# Patient Record
Sex: Male | Born: 1960 | Race: White | Hispanic: No | Marital: Married | Smoking: Current every day smoker
Health system: Southern US, Community
[De-identification: ages and names within clinical notes are randomized; demographics above are authoritative.]

## PROBLEM LIST (undated history)

## (undated) DIAGNOSIS — I2510 Atherosclerotic heart disease of native coronary artery without angina pectoris: Secondary | ICD-10-CM

## (undated) DIAGNOSIS — I219 Acute myocardial infarction, unspecified: Secondary | ICD-10-CM

## (undated) DIAGNOSIS — J189 Pneumonia, unspecified organism: Secondary | ICD-10-CM

## (undated) DIAGNOSIS — E785 Hyperlipidemia, unspecified: Secondary | ICD-10-CM

## (undated) DIAGNOSIS — M1990 Unspecified osteoarthritis, unspecified site: Secondary | ICD-10-CM

## (undated) DIAGNOSIS — I10 Essential (primary) hypertension: Secondary | ICD-10-CM

## (undated) DIAGNOSIS — N200 Calculus of kidney: Secondary | ICD-10-CM

## (undated) DIAGNOSIS — E119 Type 2 diabetes mellitus without complications: Secondary | ICD-10-CM

## (undated) DIAGNOSIS — Z6832 Body mass index (BMI) 32.0-32.9, adult: Secondary | ICD-10-CM

## (undated) HISTORY — DX: Atherosclerotic heart disease of native coronary artery without angina pectoris: I25.10

## (undated) HISTORY — PX: CARDIAC CATHETERIZATION: SHX172

## (undated) HISTORY — DX: Hyperlipidemia, unspecified: E78.5

## (undated) HISTORY — DX: Body mass index (BMI) 32.0-32.9, adult: Z68.32

## (undated) HISTORY — PX: TRACHEOSTOMY: SUR1362

## (undated) HISTORY — PX: NO PAST SURGERIES: SHX2092

---

## 2004-04-01 DIAGNOSIS — N200 Calculus of kidney: Secondary | ICD-10-CM

## 2004-04-01 HISTORY — DX: Calculus of kidney: N20.0

## 2005-09-26 ENCOUNTER — Inpatient Hospital Stay (HOSPITAL_COMMUNITY): Admission: EM | Admit: 2005-09-26 | Discharge: 2005-09-28 | Payer: Self-pay | Admitting: Emergency Medicine

## 2005-09-27 ENCOUNTER — Encounter (INDEPENDENT_AMBULATORY_CARE_PROVIDER_SITE_OTHER): Payer: Self-pay | Admitting: Specialist

## 2007-09-02 ENCOUNTER — Emergency Department (HOSPITAL_COMMUNITY): Admission: EM | Admit: 2007-09-02 | Discharge: 2007-09-02 | Payer: Self-pay | Admitting: Emergency Medicine

## 2008-06-27 ENCOUNTER — Emergency Department (HOSPITAL_COMMUNITY): Admission: EM | Admit: 2008-06-27 | Discharge: 2008-06-27 | Payer: Self-pay | Admitting: Emergency Medicine

## 2009-01-26 ENCOUNTER — Emergency Department (HOSPITAL_COMMUNITY): Admission: EM | Admit: 2009-01-26 | Discharge: 2009-01-27 | Payer: Self-pay | Admitting: Emergency Medicine

## 2010-07-05 LAB — LIPASE, BLOOD: Lipase: 31 U/L (ref 11–59)

## 2010-07-05 LAB — COMPREHENSIVE METABOLIC PANEL
Albumin: 4.1 g/dL (ref 3.5–5.2)
CO2: 30 mEq/L (ref 19–32)
GFR calc non Af Amer: >60 mL/min (ref >60)
Sodium: 140 mEq/L (ref 135–145)
Total Bilirubin: 0.5 mg/dL (ref 0.3–1.2)
Total Protein: 7.1 g/dL (ref 6.0–8.3)

## 2010-07-05 LAB — URINALYSIS, ROUTINE W REFLEX MICROSCOPIC
Bilirubin Urine: NEGATIVE
Glucose, UA: NEGATIVE mg/dL
Leukocytes, UA: NEGATIVE
pH: 6 (ref 5.0–8.0)

## 2010-07-05 LAB — URINE CULTURE: Colony Count: NO GROWTH

## 2010-07-05 LAB — URINE MICROSCOPIC-ADD ON

## 2010-07-05 LAB — DIFFERENTIAL
Lymphocytes Relative: 28 % (ref 12–46)
Monocytes Relative: 7 % (ref 3–12)
Neutro Abs: 6.9 10*3/uL (ref 1.7–7.7)
Neutrophils Relative %: 63 % (ref 43–77)

## 2010-07-05 LAB — CBC
HCT: 43 % (ref 39.0–52.0)
Hemoglobin: 14.8 g/dL (ref 13.0–17.0)
MCV: 88.8 fL (ref 78.0–100.0)
Platelets: 207 10*3/uL (ref 150–400)
WBC: 11 10*3/uL — ABNORMAL HIGH (ref 4.0–10.5)

## 2010-07-12 LAB — DIFFERENTIAL
Basophils Relative: 0 % (ref 0–1)
Lymphocytes Relative: 29 % (ref 12–46)
Lymphs Abs: 2.6 10*3/uL (ref 0.7–4.0)
Monocytes Absolute: 0.5 10*3/uL (ref 0.1–1.0)
Neutro Abs: 5.8 10*3/uL (ref 1.7–7.7)

## 2010-07-12 LAB — URINALYSIS, ROUTINE W REFLEX MICROSCOPIC
Glucose, UA: NEGATIVE mg/dL
Hgb urine dipstick: NEGATIVE
Nitrite: NEGATIVE
Protein, ur: NEGATIVE mg/dL
Urobilinogen, UA: 1 mg/dL (ref 0.0–1.0)

## 2010-07-12 LAB — CBC
HCT: 41.7 % (ref 39.0–52.0)
Hemoglobin: 14.6 g/dL (ref 13.0–17.0)
MCHC: 35 g/dL (ref 30.0–36.0)
MCV: 88 fL (ref 78.0–100.0)
Platelets: 205 10*3/uL (ref 150–400)
RBC: 4.73 MIL/uL (ref 4.22–5.81)
RDW: 12.1 % (ref 11.5–15.5)
WBC: 9.1 10*3/uL (ref 4.0–10.5)

## 2010-07-12 LAB — COMPREHENSIVE METABOLIC PANEL
AST: 23 U/L (ref 0–37)
CO2: 29 mEq/L (ref 19–32)
Calcium: 8.9 mg/dL (ref 8.4–10.5)
Chloride: 99 mEq/L (ref 96–112)
Sodium: 137 mEq/L (ref 135–145)

## 2010-07-12 LAB — URINE CULTURE: Colony Count: NO GROWTH

## 2010-08-17 NOTE — Consult Note (Signed)
NAME:  Eddie Lewis, Eddie Lewis               ACCOUNT NO.:  [id number]   MEDICAL RECORD NO.:  [id number]          PATIENT TYPE:  INP   LOCATION:  A337                          FACILITY:  [facility]   PHYSICIAN:  Lonia Blood, M.D.      DATE OF BIRTH:  [birth year]   DATE OF CONSULTATION:  09/26/2005  DATE OF DISCHARGE:                                   CONSULTATION   PRIMARY CARE PHYSICIAN:  Patient is unassigned to [place].   REQUESTING PHYSICIAN:  Dennie Maizes, M.D.   REASON FOR CONSULTATION:  High blood sugar and preop management.   HISTORY OF PRESENT ILLNESS:  Patient is a 50 year old Caucasian man who was  admitted by Dr. Rito Ehrlich today secondary to right distal ureteral calculus  with obstruction associated with right hydronephrosis, right renal colic,  horseshoe kidneys, and elevated glucose.  He is scheduled to have a  procedure tomorrow, including cystoscopy, ureteroscopy, right retrograde  pyelogram with the possibility of stone extraction.  Patient was noted to  have high blood sugar.  With a family history of diabetes, we are being  consulted for further management.   Patient denied any polydipsia or polyphagia.  Denied any polyuria, although  he drinks a lot of Gatorade.   He has a family history of diabetes, which was in a large percentage of his  family members, but he has never been diagnosed with diabetes.   PAST MEDICAL HISTORY:  Mainly ureteral stones, tobacco dependence.   His current medications include Keflex, Flomax, and tablets that he was just  started on.   He is allergic to CODEINE.   SOCIAL HISTORY:  Patient smokes about 1-1/2 packs per day.  He is a social  drinker.  No IV drug use.   FAMILY HISTORY:  Significant for diabetes.  Some family members have  hypertension.   REVIEW OF SYSTEMS:  Mainly pain, inability to urinate, and general  discomfort from his kidney stone.   PHYSICAL EXAMINATION:  VITAL SIGNS:  Temperature 98.9, blood pressure  114/63, pulse 88,  respiratory rate 24.  His sats are 96% on room air.  GENERAL:  He is awake, alert and oriented in no acute distress.  HEENT:  PERRL.  EOMI.  NECK:  Supple.  No JVD.  No lymphadenopathy.  RESPIRATORY:  He has good air entry bilaterally.  No wheezes or rales.  CARDIOVASCULAR:  He has S1 and S2.  No murmurs.  ABDOMEN:  Obese.  Soft with some suprapubic distention, which is mildly  tender.  Positive bowel sounds.  EXTREMITIES:  No clubbing, cyanosis or edema.   His labs showed a white count of 15.7, hemoglobin 14.1, and platelet count  240 with a left shift ANC of 13.6.  Sodium is 136, potassium 3.9, chloride  107, CO2 22, glucose 141, BUN 15, creatinine 1.3, total bilirubin 0.7.  The  rest of the LFTs are normal.  His lipase is 38.  Urinalysis is negative for  glucose.  Only positive for blood.  Urine microscopy showed RBCs 11-20,  indicating some hematuria.   ASSESSMENT:  This is a 50 year old gentleman admitted  for urology procedure  and now having high blood sugar.  My impression is therefore the patient may  be a prediabetic but not quite a diabetic.  The lack of glucosuria indicates  his high blood sugar may be secondary to D5 water with intake of some  glucose, either during his stay in the ED or prior to coming to the ED;  however, with the family history of diabetes, the patient could be at risk  for having diabetes.   RECOMMENDATIONS:  Will check serial CBGs while in the hospital.  Check  hemoglobin A1C.  Depending on the number, if it is more than 7, then patient  is likely to be a diabetic and will need to start getting on some formal  treatment.  We should give sliding-scale insulin orders if the blood sugars  are more than 150.  We should give patient some nicotine patch also to help  him with his tobacco dependence.  I will follow with you and treat  accordingly, depending on the hemoglobin A1C and his serial capillary blood  glucose.  Thank you for the consult.       Lonia Blood, M.D.  Electronically Signed     LG/MEDQ  D:  09/26/2005  T:  09/26/2005  Job:  [id number]

## 2010-08-17 NOTE — H&P (Signed)
NAME:  Eddie Lewis, PFIFFNER NO.:  [id number]   MEDICAL RECORD NO.:  [id number]          PATIENT TYPE:  INP   LOCATION:  A337                          FACILITY:  [facility]   PHYSICIAN:  Dennie Maizes, M.D.   DATE OF BIRTH:  12/16/1960   DATE OF ADMISSION:  09/26/2005  DATE OF DISCHARGE:  LH                                HISTORY & PHYSICAL   CHIEF COMPLAINT:  Severe suprapubic pain radiating to the right testis.  Right ureteral calculus with obstruction.   HISTORY OF PRESENT ILLNESS:  This 50 year old male experienced severe  suprapubic pain radiating to the right testis about four days while he was  in [place], [place].  He went to the [facility] system and  evaluated there.  CT scan of the abdomen and pelvis revealed a 3 mm sized  right ureteral calculus with obstruction.  The patient had good pain relief,  and he was sent home.  He had recurrent severe pain, and he came to the  emergency room at [facility].  He is not having any fever, chills,  voiding difficulty, or gross hematuria.  There is no past history of  urolithiasis.  The CT scan of the abdomen and pelvis without contrast was  repeated.  This revealed horseshoe kidneys.  There was hydronephrosis of the  right renal moiety .  Peripelvic edema with some extravasation of urine on  the right side.  There was a 3 mm sized stone in the right distal ureter  with obstruction.  There was no stone on the left side.  The patient's pain  was not adequately controlled in the emergency room.  He has been admitted  to the hospital for pain control and further treatment.   PAST MEDICAL HISTORY:  Negative for ureterolithiasis.  No medical illnesses.   MEDICATIONS:  Keflex, Flomax, and Tylox.  The Flomax was started at [facility] for  stone passage.   ALLERGIES:  CODEINE.   PHYSICAL EXAMINATION:  GENERAL:  The patient is comfortable after receiving  parenteral narcotics.  HEENT:  Normal.  NECK:  No masses.  LUNGS:  Clear to auscultation.  HEART:  Regular rate and rhythm.  No murmurs.  ABDOMEN:  Soft.  No palpable flank mass.  No CVA tenderness.  Moderate  suprapubic tenderness was noted.  GU:  Penis normal.  Both testes are normal.  There is thickening and  induration of the left epididymis.  RECTAL:  Not done.   Urinalysis:  Blood moderate.  Nitrate negative.  Leukocyte esterase  negative.  RBCs 11-20 per high-powered field.  Serum lipase normal, 38 U/L.  BUN 15, creatinine 1.3.  Electrolytes within normal range.  Glucose is  elevated at 141.  CBC:  WBC 15.7, hemoglobin 14.1, hematocrit 41.5.   IMPRESSION:  Right distal ureteral calculus with obstruction, right  hydronephrosis, right renal colic, horseshoe kidneys, elevated glucose.   PLAN:  1.  Admit the patient to the hospital.  2.  IV fluids.  3.  Parenteral narcotics.  4.  Hospitalist consult for elevated glucose.  5.  I discussed with the patient  regarding management options.  I discussed      with him about observation versus ureteroscopy and stone extraction.  As      the patient has had severe pain for several days, he would like to      proceed with stone removal.  He is scheduled to undergo cystoscopy,      right retrograde pyelogram, right ureteroscopy with stone extraction,      and right ureteral stent placement under anesthesia on September 27, 2005.  I      have explained to the patient regarding the procedure, operative      details, alternate treatments, and outcome with possible risks and      complications.  Stone migration with the inability to extract the stone      were explained to the patient.  Bleeding, infection, and ureteral      perforation were explained to the patient, and he agreed for the      procedure to be done.      Dennie Maizes, M.D.  Electronically Signed     SK/MEDQ  D:  09/26/2005  T:  09/26/2005  Job:  [id number]

## 2010-08-17 NOTE — Op Note (Signed)
NAME:  QUINTAN, SALDIVAR NO.:  [id number]   MEDICAL RECORD NO.:  [id number]          PATIENT TYPE:  INP   LOCATION:  A337                          FACILITY:  [facility]   PHYSICIAN:  Dennie Maizes, M.D.   DATE OF BIRTH:  Mar 25, 1961   DATE OF PROCEDURE:  09/27/2005  DATE OF DISCHARGE:                                 OPERATIVE REPORT   PREOPERATIVE DIAGNOSIS:  Right distal ureteral calculus with obstruction,  right renal colic, right hydronephrosis, horseshoe kidneys.   POSTOPERATIVE DIAGNOSIS:  Right distal ureteral calculus with obstruction,  right renal colic, right hydronephrosis, horseshoe kidneys.   OPERATIVE PROCEDURE:  Cystoscopy, right retrograde pyelogram, right  ureteroscopy, stone extraction, and right ureteral stent placement.   ANESTHESIA:  General.   SURGEON:  Dennie Maizes, M.D.   COMPLICATIONS:  None.   DRAINS:  38 French 6 cm size right ureteral stent with a string, 16-French  Foley catheter in the bladder.   INDICATIONS FOR THE PROCEDURE:  This 50 year old male is evaluated for  severe right flank pain.  X-rays revealed horseshoe kidneys with obstruction  of the right collecting system due to a 3 mm size distal ureteral calculus.  The patient was unable to pass the stone.  He was taken to the O.R. today  for cystoscopy, retrograde pyelogram, ureteroscopy, stone extraction, and  stent placement.   DESCRIPTION OF THE PROCEDURE:  General anesthesia was induced, and the  patient was placed on the O.R. table in the dorsal lithotomy position.  The  lower abdomen and genitalia were prepped and draped in a sterile fashion.  Examination revealed a mild glandular hypospadias.  The urethral meatus was  slightly on the ventral side of the glans penis.  A 22 French cystoscope was  then introduced into the bladder under direct vision.  The urethra was  normal.  The prostate was small.  There was no evidence of any obstruction.  The bladder mucosa, trigone,  and ureteral orifices were normal.  A 5 French  rigid catheter was then placed in the right ureteral orifice.  About 7 cc of  Renografin-60 was injected into the collecting system.  A retrograde  pyelogram was done by using C-arm fluoroscopy.  There was a 3 mm size  filling defect in the distal ureter at the level of the pelvic brim.  There  was proximal hydroureter and hydronephrosis.   A 5 French open-ended catheter was then placed in the right distal ureter.  A 0.138 inch Bentson guidewire was then inserted into the right collecting  system without any difficulty.   The distal ureter was dilated using a using an 18 French 6 cm balloon  dilating catheter.  The balloon dilating catheter was then removed, leaving  the guidewire in place.  Ureteroscopy was done with a rigid ureteroscope.  The stone was seen at the level of the pelvic brim.  The stone was extracted  with the nitinol 0 tip  4 wire basket without any difficulty.  A 6 French 24  cm size stent was then inserted in the right collecting system.  The  cystoscope  was then removed.  A 16 French Foley catheter was inserted into  the bladder.  The patient was transferred to the PACU in a satisfactory  condition.      Dennie Maizes, M.D.  Electronically Signed     SK/MEDQ  D:  09/27/2005  T:  09/27/2005  Job:  [id number]

## 2010-08-17 NOTE — Discharge Summary (Signed)
NAME:  Eddie Lewis, Eddie Lewis NO.:  [id number]   MEDICAL RECORD NO.:  [id number]          PATIENT TYPE:  INP   LOCATION:  A337                          FACILITY:  [facility]   PHYSICIAN:  Dennie Maizes, M.D.   DATE OF BIRTH:  1960-10-31   DATE OF ADMISSION:  09/26/2005  DATE OF DISCHARGE:  06/30/2007LH                                 DISCHARGE SUMMARY   CONSULTING PHYSICIAN:  Dr. Mikeal Hawthorne   FINAL DIAGNOSES:  1.  Right distal ureteral calculus with obstruction.  2.  Right renal colic.  3.  Right hydronephrosis.  4.  Horseshoe kidneys.  5.  Elevated glucose.   OPERATIVE PROCEDURE:  Cystoscopy, right retrograde pyelogram, right  ureteroscopy stone extraction, and right ureteral stent placement done on  September 27, 2005.   COMPLICATIONS:  None.   DISCHARGE SUMMARY:  This 50 year old male experienced severe suprapubic pain  radiating to the right testes on September 22, 2005 when he was in  [place], [place].  He went to the [facility] and evaluated there.  CT scan of the abdomen and pelvis revealed a 3  mm right ureteral calculus with obstruction.  The patient had good pain  relief and he was sent home.  Had recurrence of the pain and he came to the  Emergency Room at [facility].  He was not having any fever, chills,  voiding difficulty, or gross hematuria.  There was no past history of  urolithiasis.  The CT scan of abdomen and pelvis without contrast was  repeated.  This revealed horseshoe kidneys.  There was hydronephrosis of the  right renal moiety .  Peripelvic edema with some extravasation of urine was  noted on the right side.  There was a 3 mm size stone in the right distal  ureter with obstruction.  There was no stone on the left side.  The  patient's pain was not adequately controlled in the emergency room.  He was  admitted to the hospital for pain control and further treatment.   PAST MEDICAL HISTORY:  Negative for  urolithiasis.  No medical illnesses.   MEDICATIONS:  Keflex, Flomax, and Tylox.  The Flomax was started at [facility] for  stone passage.   ALLERGIES:  CODEINE.   PHYSICAL EXAMINATION:  GENERAL:  The patient was comfortable after receiving  parenteral narcotics.  HEENT:  Normal.  NECK:  No masses.  LUNGS:  Clear to auscultation.  HEART:  Regular rate and rhythm.  No murmurs.  ABDOMEN:  Soft.  No palpable flank mass or CVA tenderness.  Moderate  suprapubic tenderness was noted.  GENITOURINARY:  Penis and testes were normal.  There was thickening,  induration of the left epididymis.  RECTAL:  Not done.   ADMISSION LABORATORIES:  Blood moderate, nitrate negative, leukocyte  esterase negative, rbc 11-20 per high powered field.  Serum lipase normal.  BUN 15, creatinine 1.3.  Electrolytes within normal range.  Glucose was  elevated 141.  CBC:  WBC 15.7, hemoglobin 14.1, hematocrit 41.5.   HOSPITAL COURSE:  The patient was admitted to the hospital and treated  with  IV fluids and parenteral narcotics.  Dr. Mikeal Hawthorne, the hospitalist, was  consulted regarding elevated blood glucose levels.  Please refer to his  consultation report.  Hemoglobin A1c was checked.  His blood glucose levels  were closely monitored.  On September 27, 2005 patient was taken to the OR.  Under general anesthesia cystoscopy, right retrograde pyelogram, right  ureteroscopy stone extraction, and right ureteral stent placement were done.  Stone was sent for [employer].  The patient also had Foley catheter  drainage.  He was discharged and sent home on September 28, 2005.  At the time of  discharge he was doing well.  He remained afebrile and his glucose was 133.  Renal function was normal.  He was given Cipro 500 mg p.o. b.i.d. for seven  days and he was advised to continue the pain pills.  He will return to the  office on October 25, 2005 at which time the Foley catheter and ureteral stent  with the string will be removed.    CONDITION ON DISCHARGE:  Stable.  He will see his regular family physician  for follow-up of elevated blood glucose levels.      Dennie Maizes, M.D.  Electronically Signed     SK/MEDQ  D:  10/25/2005  T:  10/25/2005  Job:  [id number]

## 2010-09-18 IMAGING — CT CT ABDOMEN W/O CM
2 of 4 series · 16 of 46 positions shown, 18 images · non-contrast
Comparison: Calcified granulomas in the right lower lobes noted.

CLINICAL DATA: Right flank pain.  History urinary tract stones.

CT ABDOMEN AND PELVIS WITHOUT CONTRAST
TECHNIQUE: Multidetector CT imaging of the abdomen and pelvis was
performed following the standard protocol without intravenous
contrast.

[Series 2: standard/full over (age)lbs 5.0 · axial · 0.70mm/px · z∈[-466,-52]mm · 13 of 91 slices shown, 15 images]
[im 4/91  soft-tissue]
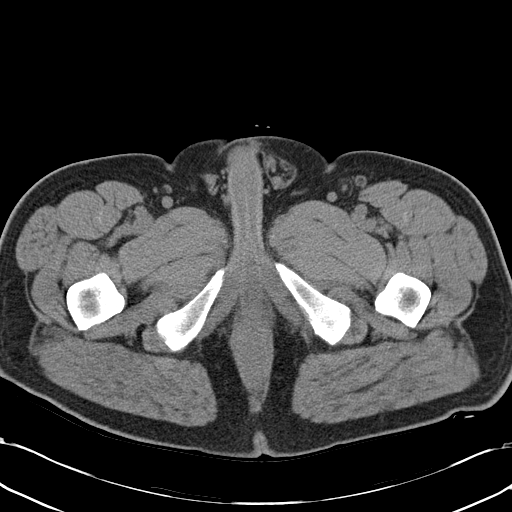
[im 4/91  bone]
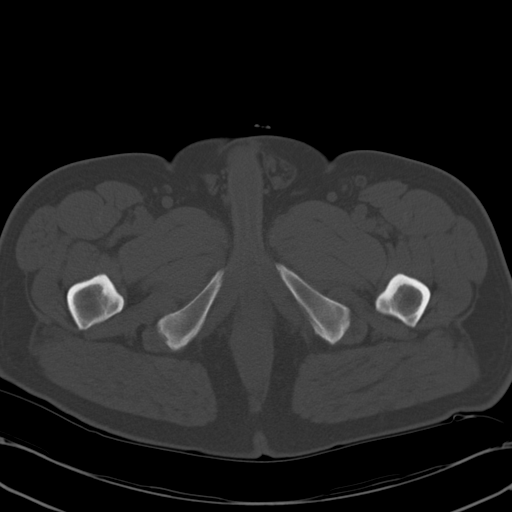
[im 11/91  soft-tissue]
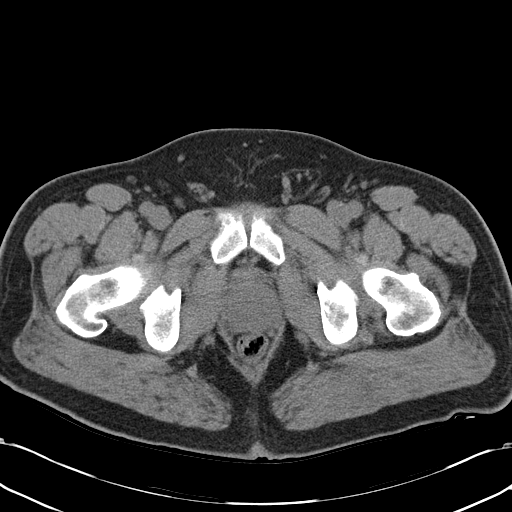
[im 19/91  soft-tissue]
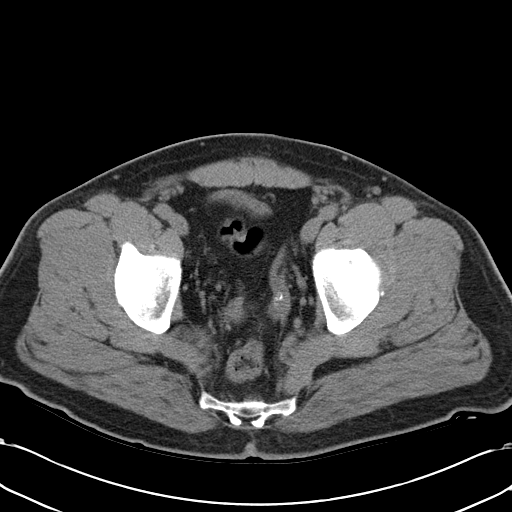
[im 26/91  soft-tissue]
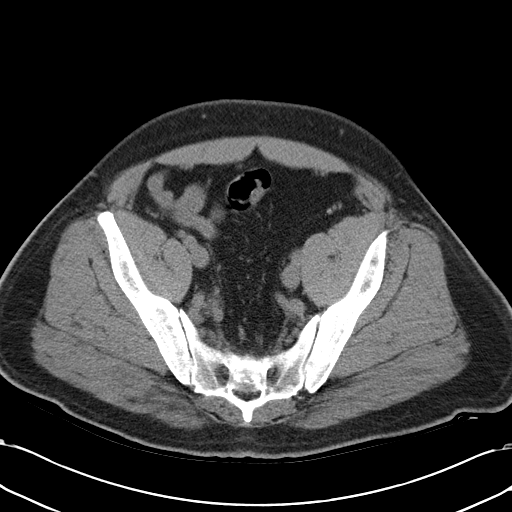
[im 33/91  soft-tissue]
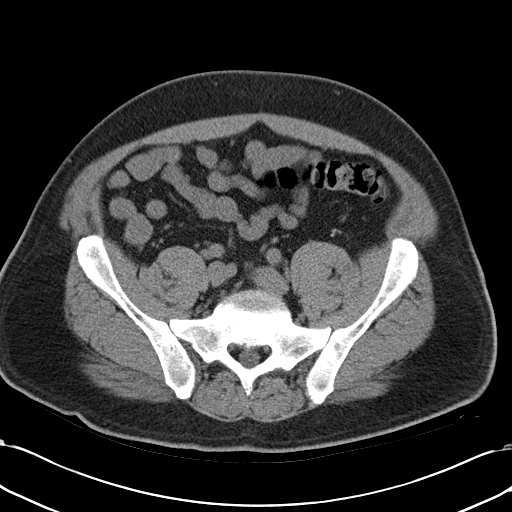
[im 40/91  soft-tissue]
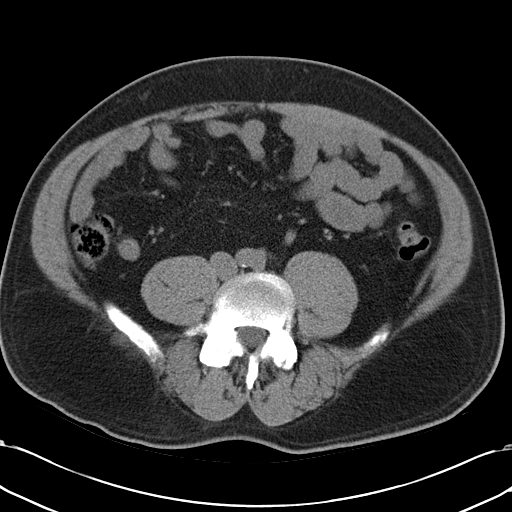
[im 47/91  soft-tissue]
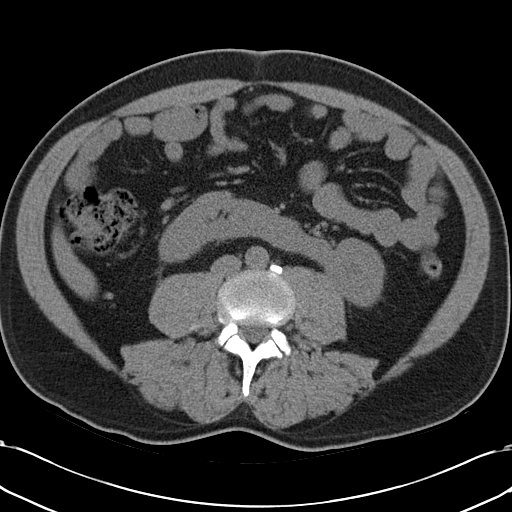
[im 51/91  soft-tissue]
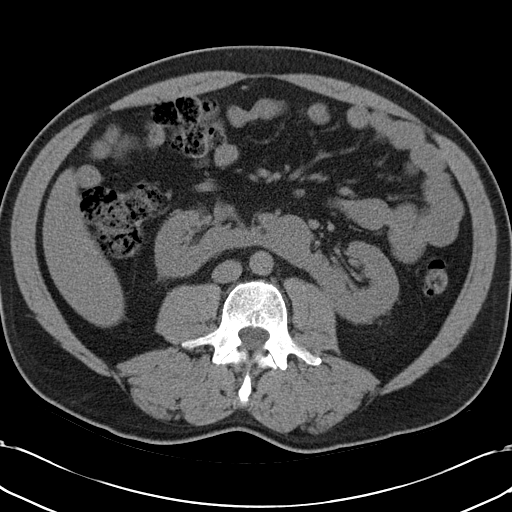
[im 58/91  soft-tissue]
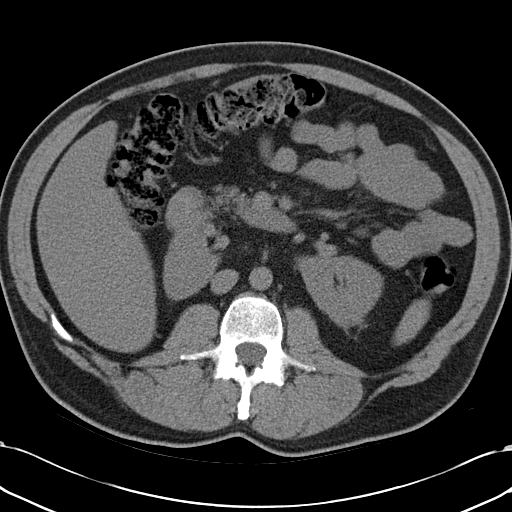
[im 58/91  bone]
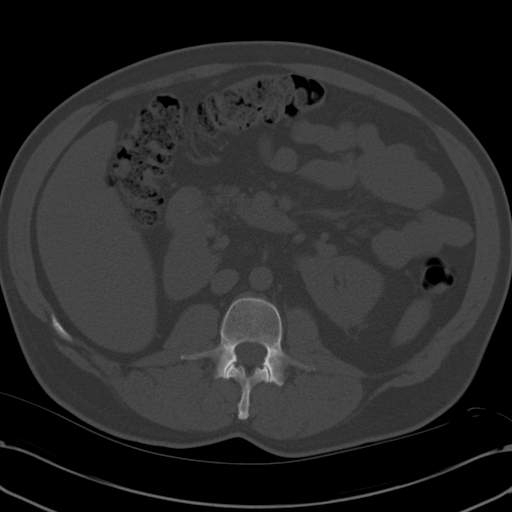
[im 65/91  soft-tissue]
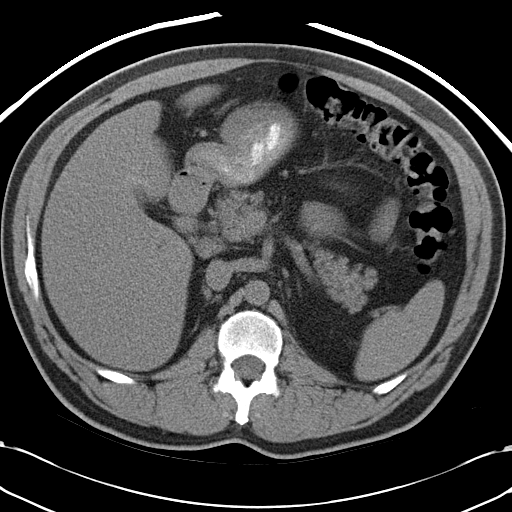
[im 73/91  soft-tissue]
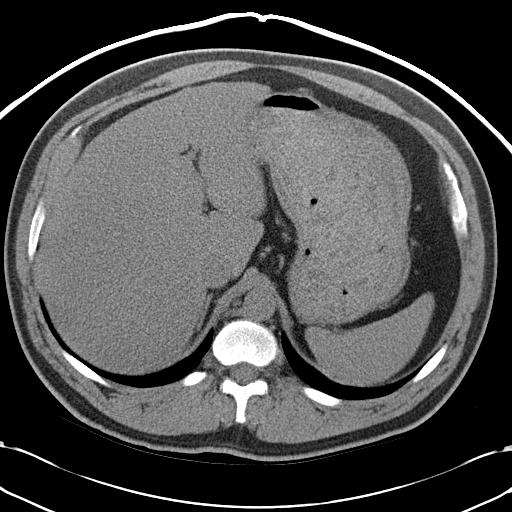
[im 80/91  soft-tissue]
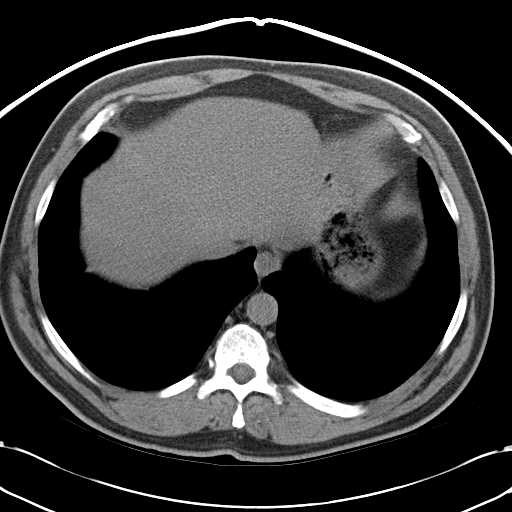
[im 87/91  soft-tissue]
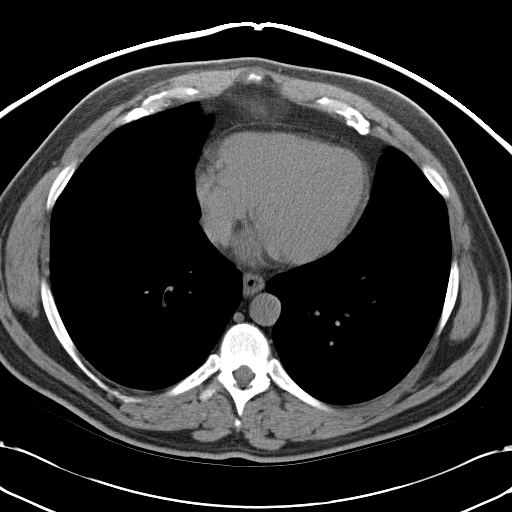

[Series 4: mpr coronal · coronal · 0.68mm/px · 3 of 88 slices shown]
[im 30/88  soft-tissue]
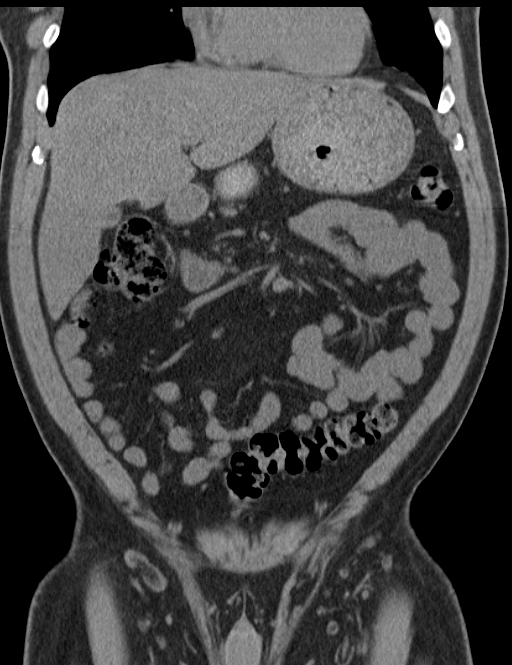
[im 39/88  soft-tissue]
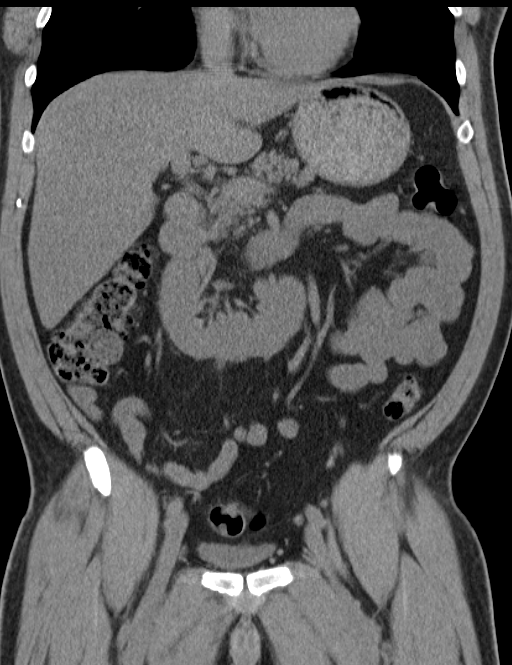
[im 49/88  soft-tissue]
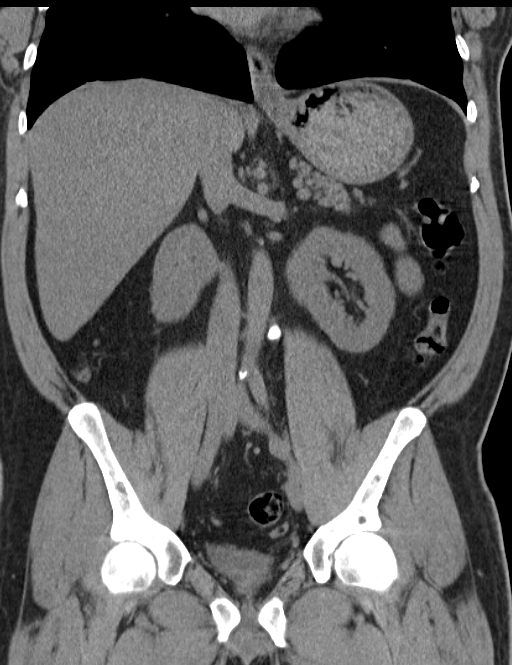

[16 of 46 positions shown; findings below may reference images not displayed]

The lung bases are otherwise clear.  No pleural or pericardial
effusion.

Horseshoe kidney is noted as seen on prior examination.  There is
no hydronephrosis and no renal or ureteral stones.  The uninfused
appearance of the liver, spleen, pancreas and adrenal glands is
normal.  The gallbladder is decompressed but otherwise
unremarkable.  Stomach and small bowel appear normal.  No abdominal
lymphadenopathy or fluid.  Lower lumbar spondylosis is noted.

CT ABDOMEN
FINDINGS: 1.  Negative for urinary tract stones or hydronephrosis.
2.  Horseshoe kidney.
IMPRESSION: Prostate gland is mildly enlarged.  Urinary bladder is
unremarkable.  No pelvic lymphadenopathy or fluid.  The colon
appears normal.  Appendix appears normal.  No bony abnormality.

CT PELVIS
FINDINGS: Prostate gland is mildly enlarged.  Urinary bladder
seminal vesicles unremarkable.  Colon and appendix appear normal.
No pelvic lymphadenopathy or fluid.  No focal bony abnormality.
IMPRESSION: 1.  No acute finding in the pelvis.
2.  Enlarged prostate gland.

## 2011-11-28 HISTORY — PX: CORONARY STENT PLACEMENT: SHX1402

## 2011-11-29 ENCOUNTER — Encounter (HOSPITAL_COMMUNITY): Payer: Self-pay | Admitting: *Deleted

## 2011-11-29 ENCOUNTER — Inpatient Hospital Stay (HOSPITAL_COMMUNITY)
Admission: EM | Admit: 2011-11-29 | Discharge: 2011-12-04 | DRG: 247 | Disposition: A | Discharge status: Home / Self Care | Payer: PRIVATE HEALTH INSURANCE | Attending: Cardiology | Admitting: Cardiology

## 2011-11-29 ENCOUNTER — Ambulatory Visit (HOSPITAL_COMMUNITY): Admit: 2011-11-29 | Payer: Self-pay | Admitting: Cardiology

## 2011-11-29 ENCOUNTER — Emergency Department (HOSPITAL_COMMUNITY): Payer: PRIVATE HEALTH INSURANCE

## 2011-11-29 ENCOUNTER — Encounter (HOSPITAL_COMMUNITY): Payer: Self-pay

## 2011-11-29 ENCOUNTER — Encounter (HOSPITAL_COMMUNITY)
Admission: EM | Disposition: A | Discharge status: Home / Self Care | Payer: Self-pay | Source: Home / Self Care | Attending: Cardiology

## 2011-11-29 DIAGNOSIS — Z23 Encounter for immunization: Secondary | ICD-10-CM

## 2011-11-29 DIAGNOSIS — I213 ST elevation (STEMI) myocardial infarction of unspecified site: Secondary | ICD-10-CM

## 2011-11-29 DIAGNOSIS — I2109 ST elevation (STEMI) myocardial infarction involving other coronary artery of anterior wall: Principal | ICD-10-CM | POA: Diagnosis present

## 2011-11-29 DIAGNOSIS — I2510 Atherosclerotic heart disease of native coronary artery without angina pectoris: Secondary | ICD-10-CM | POA: Diagnosis present

## 2011-11-29 DIAGNOSIS — E739 Lactose intolerance, unspecified: Secondary | ICD-10-CM | POA: Diagnosis present

## 2011-11-29 DIAGNOSIS — F17200 Nicotine dependence, unspecified, uncomplicated: Secondary | ICD-10-CM | POA: Diagnosis present

## 2011-11-29 DIAGNOSIS — I10 Essential (primary) hypertension: Secondary | ICD-10-CM | POA: Diagnosis present

## 2011-11-29 DIAGNOSIS — Z955 Presence of coronary angioplasty implant and graft: Secondary | ICD-10-CM

## 2011-11-29 DIAGNOSIS — E7800 Pure hypercholesterolemia, unspecified: Secondary | ICD-10-CM | POA: Diagnosis present

## 2011-11-29 HISTORY — PX: LEFT AND RIGHT HEART CATHETERIZATION WITH CORONARY ANGIOGRAM: SHX5449

## 2011-11-29 HISTORY — DX: Calculus of kidney: N20.0

## 2011-11-29 HISTORY — PX: PERCUTANEOUS CORONARY STENT INTERVENTION (PCI-S): SHX5485

## 2011-11-29 LAB — POCT I-STAT, CHEM 8
Creatinine, Ser: 0.9 mg/dL (ref 0.50–1.35)
HCT: 42 % (ref 39.0–52.0)
Hemoglobin: 14.3 g/dL (ref 13.0–17.0)
Potassium: 3.6 mEq/L (ref 3.5–5.1)
Sodium: 142 mEq/L (ref 135–145)

## 2011-11-29 LAB — HEMOGLOBIN A1C: Hgb A1c MFr Bld: 5.9 % — ABNORMAL HIGH (ref <5.7)

## 2011-11-29 LAB — DIFFERENTIAL
Basophils Absolute: 0 10*3/uL (ref 0.0–0.1)
Lymphocytes Relative: 34 % (ref 12–46)
Monocytes Absolute: 0.9 10*3/uL (ref 0.1–1.0)
Neutro Abs: 6.4 10*3/uL (ref 1.7–7.7)

## 2011-11-29 LAB — POCT ACTIVATED CLOTTING TIME
Activated Clotting Time: 314 seconds
Activated Clotting Time: 129 seconds

## 2011-11-29 LAB — BASIC METABOLIC PANEL
CO2: 25 mEq/L (ref 19–32)
Calcium: 8.9 mg/dL (ref 8.4–10.5)
GFR calc Af Amer: >90 mL/min (ref >90)
GFR calc non Af Amer: >90 mL/min (ref >90)
Sodium: 139 mEq/L (ref 135–145)

## 2011-11-29 LAB — PROTIME-INR
INR: 1.02 (ref 0.00–1.49)
INR: 1.85 — ABNORMAL HIGH (ref 0.00–1.49)
Prothrombin Time: 21.7 seconds — ABNORMAL HIGH (ref 11.6–15.2)

## 2011-11-29 LAB — CBC
MCHC: 34.1 g/dL (ref 30.0–36.0)
WBC: 11.6 10*3/uL — ABNORMAL HIGH (ref 4.0–10.5)

## 2011-11-29 LAB — APTT: aPTT: 26 seconds (ref 24–37)

## 2011-11-29 LAB — CBC WITH DIFFERENTIAL/PLATELET
Basophils Relative: 0 % (ref 0–1)
Eosinophils Absolute: 0 10*3/uL (ref 0.0–0.7)
Hemoglobin: 13.1 g/dL (ref 13.0–17.0)
MCH: 29.5 pg (ref 26.0–34.0)
MCHC: 33.3 g/dL (ref 30.0–36.0)
Monocytes Relative: 5 % (ref 3–12)
Neutrophils Relative %: 82 % — ABNORMAL HIGH (ref 43–77)

## 2011-11-29 LAB — COMPREHENSIVE METABOLIC PANEL
ALT: 38 U/L (ref 0–53)
BUN: 13 mg/dL (ref 6–23)
Calcium: 8.9 mg/dL (ref 8.4–10.5)
GFR calc Af Amer: >90 mL/min (ref >90)
Glucose, Bld: 122 mg/dL — ABNORMAL HIGH (ref 70–99)
Sodium: 138 mEq/L (ref 135–145)
Total Protein: 6 g/dL (ref 6.0–8.3)

## 2011-11-29 LAB — LIPID PANEL
Cholesterol: 142 mg/dL (ref 0–200)
VLDL: 31 mg/dL (ref 0–40)

## 2011-11-29 LAB — POCT I-STAT TROPONIN I: Troponin i, poc: 0.01 ng/mL (ref 0.00–0.08)

## 2011-11-29 LAB — TROPONIN I
Troponin I: >20 ng/mL (ref <0.30)
Troponin I: >20 ng/mL (ref <0.30)
Troponin I: >20 ng/mL (ref <0.30)

## 2011-11-29 LAB — MAGNESIUM: Magnesium: 1.9 mg/dL (ref 1.5–2.5)

## 2011-11-29 SURGERY — PERCUTANEOUS CORONARY STENT INTERVENTION (PCI-S)

## 2011-11-29 SURGERY — LEFT HEART CATH
Anesthesia: LOCAL

## 2011-11-29 MED ORDER — ONDANSETRON HCL 4 MG/2ML IJ SOLN: 4.0000 mg | Freq: Four times a day (QID) | INTRAMUSCULAR | Status: DC | PRN

## 2011-11-29 MED ORDER — NITROGLYCERIN 0.2 MG/ML ON CALL CATH LAB
INTRAVENOUS | Status: AC
  Filled 2011-11-29: qty 1

## 2011-11-29 MED ORDER — NITROGLYCERIN IN D5W 200-5 MCG/ML-% IV SOLN
5.0000 ug/min | INTRAVENOUS | Status: DC
  Administered 2011-11-29: 5 ug/min via INTRAVENOUS

## 2011-11-29 MED ORDER — NITROGLYCERIN 0.4 MG SL SUBL: 0.4000 mg | SUBLINGUAL_TABLET | SUBLINGUAL | Status: DC | PRN

## 2011-11-29 MED ORDER — TICAGRELOR 90 MG PO TABS
ORAL_TABLET | ORAL | Status: AC
  Filled 2011-11-29: qty 2

## 2011-11-29 MED ORDER — BIVALIRUDIN 250 MG IV SOLR
INTRAVENOUS | Status: AC
  Administered 2011-11-29: 06:00:00 via INTRAVENOUS
  Filled 2011-11-29: qty 250

## 2011-11-29 MED ORDER — HEPARIN BOLUS VIA INFUSION: 4000.0000 [IU] | Freq: Once | INTRAVENOUS | Status: DC

## 2011-11-29 MED ORDER — ALTEPLASE 50 MG IV SOLR: 10.0000 mg | INTRAVENOUS | Status: DC

## 2011-11-29 MED ORDER — HEPARIN SODIUM (PORCINE) 5000 UNIT/ML IJ SOLN: 4000.0000 [IU] | Freq: Once | INTRAMUSCULAR | Status: DC

## 2011-11-29 MED ORDER — MORPHINE SULFATE 4 MG/ML IJ SOLN
4.0000 mg | Freq: Once | INTRAMUSCULAR | Status: AC
  Administered 2011-11-29: 4 mg via INTRAVENOUS
  Filled 2011-11-29: qty 1

## 2011-11-29 MED ORDER — LIDOCAINE HCL (PF) 1 % IJ SOLN
INTRAMUSCULAR | Status: AC
  Filled 2011-11-29: qty 30

## 2011-11-29 MED ORDER — ALTEPLASE 100 MG IV SOLR
10.0000 mg | Freq: Once | INTRAVENOUS | Status: DC
  Administered 2011-11-29: 10 mg via INTRAVENOUS
  Filled 2011-11-29: qty 10

## 2011-11-29 MED ORDER — METOPROLOL TARTRATE 12.5 MG HALF TABLET
12.5000 mg | ORAL_TABLET | Freq: Two times a day (BID) | ORAL | Status: DC
  Administered 2011-11-29 – 2011-12-04 (×9): 12.5 mg via ORAL
  Filled 2011-11-29 (×14): qty 1

## 2011-11-29 MED ORDER — CLOPIDOGREL BISULFATE 300 MG PO TABS: 300.0000 mg | ORAL_TABLET | Freq: Once | ORAL | Status: DC

## 2011-11-29 MED ORDER — HEPARIN (PORCINE) IN NACL 2-0.9 UNIT/ML-% IJ SOLN
INTRAMUSCULAR | Status: AC
  Filled 2011-11-29: qty 2000

## 2011-11-29 MED ORDER — ONDANSETRON HCL 4 MG/2ML IJ SOLN
INTRAMUSCULAR | Status: AC
  Filled 2011-11-29: qty 2

## 2011-11-29 MED ORDER — ASPIRIN 300 MG RE SUPP
300.0000 mg | RECTAL | Status: AC
  Filled 2011-11-29: qty 1

## 2011-11-29 MED ORDER — ACETAMINOPHEN 325 MG PO TABS: 650.0000 mg | ORAL_TABLET | ORAL | Status: DC | PRN

## 2011-11-29 MED ORDER — MIDAZOLAM HCL 2 MG/2ML IJ SOLN
INTRAMUSCULAR | Status: AC
  Filled 2011-11-29: qty 2

## 2011-11-29 MED ORDER — MORPHINE SULFATE 4 MG/ML IJ SOLN
INTRAMUSCULAR | Status: AC
  Administered 2011-11-29: 4 mg
  Filled 2011-11-29: qty 1

## 2011-11-29 MED ORDER — PANTOPRAZOLE SODIUM 40 MG PO TBEC
40.0000 mg | DELAYED_RELEASE_TABLET | Freq: Every day | ORAL | Status: DC
  Administered 2011-11-30 – 2011-12-04 (×5): 40 mg via ORAL
  Filled 2011-11-29 (×5): qty 1

## 2011-11-29 MED ORDER — SODIUM CHLORIDE 0.9 % IV SOLN
INTRAVENOUS | Status: DC
  Administered 2011-11-29: 10 mL/h via INTRAVENOUS

## 2011-11-29 MED ORDER — PNEUMOCOCCAL VAC POLYVALENT 25 MCG/0.5ML IJ INJ
0.5000 mL | INJECTION | INTRAMUSCULAR | Status: AC
  Administered 2011-11-30: 0.5 mL via INTRAMUSCULAR
  Filled 2011-11-29: qty 0.5

## 2011-11-29 MED ORDER — ATROPINE SULFATE 1 MG/ML IJ SOLN
INTRAMUSCULAR | Status: AC
  Filled 2011-11-29: qty 1

## 2011-11-29 MED ORDER — EPTIFIBATIDE 75 MG/100ML IV SOLN
2.0000 ug/kg/min | INTRAVENOUS | Status: AC
  Administered 2011-11-29 – 2011-11-30 (×5): 2 ug/kg/min via INTRAVENOUS
  Filled 2011-11-29 (×10): qty 100

## 2011-11-29 MED ORDER — FENTANYL CITRATE 0.05 MG/ML IJ SOLN
INTRAMUSCULAR | Status: AC
  Filled 2011-11-29: qty 2

## 2011-11-29 MED ORDER — EPTIFIBATIDE 75 MG/100ML IV SOLN
INTRAVENOUS | Status: AC
  Filled 2011-11-29: qty 100

## 2011-11-29 MED ORDER — TICAGRELOR 90 MG PO TABS
90.0000 mg | ORAL_TABLET | Freq: Two times a day (BID) | ORAL | Status: DC
  Administered 2011-11-29 – 2011-12-04 (×11): 90 mg via ORAL
  Filled 2011-11-29 (×13): qty 1

## 2011-11-29 MED ORDER — ASPIRIN 81 MG PO CHEW: 324.0000 mg | CHEWABLE_TABLET | ORAL | Status: AC

## 2011-11-29 MED ORDER — ATORVASTATIN CALCIUM 80 MG PO TABS
80.0000 mg | ORAL_TABLET | Freq: Every day | ORAL | Status: DC
  Administered 2011-11-29 – 2011-12-03 (×5): 80 mg via ORAL
  Filled 2011-11-29 (×7): qty 1

## 2011-11-29 MED ORDER — BIVALIRUDIN 250 MG IV SOLR
INTRAVENOUS | Status: AC
  Filled 2011-11-29: qty 250

## 2011-11-29 MED ORDER — SODIUM CHLORIDE 0.9 % IV SOLN
INTRAVENOUS | Status: AC
  Administered 2011-11-29: 150 mL/h via INTRAVENOUS

## 2011-11-29 MED ORDER — ACETAMINOPHEN 325 MG PO TABS
650.0000 mg | ORAL_TABLET | ORAL | Status: DC | PRN
  Administered 2011-11-29: 650 mg via ORAL
  Filled 2011-11-29 (×2): qty 2

## 2011-11-29 MED ORDER — TICAGRELOR 90 MG PO TABS
180.0000 mg | ORAL_TABLET | Freq: Once | ORAL | Status: DC
  Filled 2011-11-29: qty 2

## 2011-11-29 MED ORDER — ASPIRIN EC 81 MG PO TBEC
81.0000 mg | DELAYED_RELEASE_TABLET | Freq: Every day | ORAL | Status: DC
  Administered 2011-11-30 – 2011-12-02 (×3): 81 mg via ORAL
  Filled 2011-11-29 (×4): qty 1

## 2011-11-29 MED ORDER — NITROGLYCERIN IN D5W 200-5 MCG/ML-% IV SOLN
INTRAVENOUS | Status: AC
  Administered 2011-11-29: 50000 ug
  Filled 2011-11-29: qty 250

## 2011-11-29 NOTE — ED Provider Notes (Signed)
History     CSN: [id number]  Arrival date & time 11/29/11  4098   First MD Initiated Contact with Patient 11/29/11 [phone]      Chief Complaint  Patient presents with  . Code STEMI    (Consider location/radiation/quality/duration/timing/severity/associated sxs/prior treatment) HPI  Pt is an obese, 51 yo 1ppd smoker who does not receive routine medical care. He presents via EMS with complaints of centrally located chest pressure which began approx 1hr pta and was 8/10 at most severe. 4/10 after tx with ASA and MS 4mg  IV en route. Denies associated SOB. Has been nauseated. No vomiting. Pain radiates to neck bilaterally, notes stuttering episodes of less severe pain over the past 2 to 3 days.   Patient denies FH of CAD. He says he has no known h/o HTN, HL or DM. Has never had any cardiac evaluation.   History reviewed. No pertinent past medical history.  History reviewed. No pertinent past surgical history.  History reviewed. No pertinent family history.  History  Substance Use Topics  . Smoking status: Not on file  . Smokeless tobacco: Not on file  . Alcohol Use: No   Smokes 1ppd   Review of Systems  LImited ROS obtained from the patient due to emergent nature of presentation and need to expedite transfer to cardiac cath lab. Pt denies fever, rigors.  Denies SOB, has chronic smoker's cough, no hemoptysis.  CV sx as noted. No orthopnea or DOE. No abdominal pain. Transient nausea. No melena or hematochezia. No GU sx. No LE edema.    Allergies  Review of patient's allergies indicates not on file.  Denies allergies to meds.   Home Medications  No current outpatient prescriptions on file.  No outpatient meds  BP 132/97  Pulse 117  Temp 98.2 F (36.8 C) (Oral)  Resp 22  SpO2 99%  Physical Exam\  gen: appears anxious and mildly uncomfortable well nourished and well developed appearing head: NCAT eyes: pupils equal round and reactive, EOMI nose: no epistaxis mouth: mucosa  moist and pink Neck: supple, no stridor Lungs: CTA bialterally CV: RRR, no murmur, skin well perfused, palp extremity pulses, no dependent edema Abd: obese, soft, nontender, unable to adequately assess for organomegaly in light of body habitus GYN: not indicated Neuro: no focal deficits Psyche: mildly anxious appearing, good insight, calm and appropriate   ED Course  Procedures  No procedure performed by me.    Labs Reviewed  CBC  DIFFERENTIAL  PROTIME-INR  APTT  BASIC METABOLIC PANEL   No results found.   No diagnosis found.  EKG: normal sinus rythm, normal rate, normal axis, normal QRS complex, ST elevation in leads I and V2 with recipricol ST depression in the inferior leads.   CXR: no acute cardiopulmonary process identified, mediastinum has normal contours.    MDM  A code stemi was initiated p rior to the patient's arrival and Dr. Maudry Mayhew notified. Dr. Maudry Mayhew notified again as the patient arrived in ED. He presented shortly thereafter to the ED.  Pt treated for residual pain with MS 4mg  IV x 4. He has received ASA PTA.  EMS reported a single BP of 80/40 en route which came up with IVF. Thus, we we elected not to tx with BB or NTG in ED. Heparin 4000 unit bolus and Ticagralor 180mg  po were ordered by me but not administered prior to transfer to the cath lab. Dr. Maudry Mayhew notified of this.         Brandt Loosen, MD  11/29/11 0426 

## 2011-11-29 NOTE — Progress Notes (Signed)
Subjective:  Patient denies any chest pain or shortness of breath states feels better after PCI  Objective:  Vital Signs in the last 24 hours: Temp:  [97.5 F (36.4 C)-98.2 F (36.8 C)] 97.8 F (36.6 C) (08/30 0800) Pulse Rate:  [77-117] 77  (08/30 1200) Resp:  [14-22] 21  (08/30 1200) BP: (94-132)/(65-97) 128/82 mmHg (08/30 1200) SpO2:  [96 %-100 %] 97 % (08/30 1200) Arterial Line BP: (110-122)/(74-81) 113/75 mmHg (08/30 1200) Weight:  [84.1 kg (185 lb 6.5 oz)] 84.1 kg (185 lb 6.5 oz) (08/30 0607)  Intake/Output from previous day: 08/29 0701 - 08/30 0700 In: 359.1 [I.V.:359.1] Out: -  Intake/Output from this shift: Total I/O In: 670.1 [I.V.:670.1] Out: 800 [Urine:800]  Physical Exam: Neck: no adenopathy, no carotid bruit, no JVD and supple, symmetrical, trachea midline Lungs: clear to auscultation bilaterally Heart: regular rate and rhythm, S1, S2 normal, no murmur, click, rub or gallop Abdomen: soft, non-tender; bowel sounds normal; no masses,  no organomegaly Extremities: extremities normal, atraumatic, no cyanosis or edema Who Thedore Mins noted at the right groin catheter site no hematoma sheath is being pulled out Lab Results:  Basename 11/29/11 1227 11/29/11 0715 11/29/11 0357 11/29/11 0350  WBC -- 10.8* -- 11.6*  HGB -- 13.1 14.3 --  PLT 189 167 -- --    Basename 11/29/11 0715 11/29/11 0357 11/29/11 0350  NA 138 142 --  K 4.2 3.6 --  CL 105 105 --  CO2 24 -- 25  GLUCOSE 122* 152* --  BUN 13 15 --  CREATININE 0.72 0.90 --    Basename 11/29/11 1227 11/29/11 0707  TROPONINI >20.00* >20.00*   Hepatic Function Panel  Basename 11/29/11 0715  PROT 6.0  ALBUMIN 3.4*  AST 159*  ALT 38  ALKPHOS 44  BILITOT 0.3  BILIDIR --  IBILI --    Basename 11/29/11 0715  CHOL 142   No results found for this basename: PROTIME in the last 72 hours  Imaging: Imaging results have been reviewed and Dg Chest Portable 1 View  11/29/2011  *RADIOLOGY REPORT*  Clinical Data:  STEMI  PORTABLE CHEST - 1 VIEW  Comparison: None.  Findings: Heart size within normal range. Mild right paratracheal/suprahilar fullness is likely vascular.  Mild left greater right lung base opacities.  Mild peribronchial thickening and reticulonodular interstitial prominence.  No pleural effusion or pneumothorax.  No acute osseous finding.  IMPRESSION: Mild bibasilar opacities; likely atelectasis.  Peribronchial thickening and interstitial prominence is nonspecific.  May reflect acute or chronic bronchitic change.  Right paratracheal/suprahilar fullness.  Recommend attention with PA and lateral radiograph follow-up.   Original Report Authenticated By: Waneta Martins, M.D.     Cardiac Studies:  Assessment/Plan:  Acute anterolateral wall myocardial infarction status post PCI 100% occluded LAD with excellent results and intra-coronary TPA and right coronary artery infusion Morbid obesity Tobacco abuse Hypercholesteremia Plan Continue present management Check labs in a.m.  LOS: 0 days    Roch Quach N 11/29/2011, 1:59 PM

## 2011-11-29 NOTE — ED Notes (Addendum)
Pt. Has c/o of mid sternal chest pain that started about 1 hour ago. Pt. Has c/o SOB and bil;ateral arm numbness.  Cath lab and Care link called by EMS.  Dr. At bedside .  Pt. Given 324mg  ASA by EMS.

## 2011-11-29 NOTE — Progress Notes (Signed)
Began discussing MI and risk factor modification with pt. Requests his name be sent to [facility]. Will f/u tomorrow for ambulation.  [id number] Ethelda Chick CES, ACSM

## 2011-11-29 NOTE — Progress Notes (Signed)
Chaplain Note:  Chaplain responded immediately to Code STEMI page received at 04:00.  Chaplain provided spiritual comfort, support, and prayer for pt and pt's family.  Chaplain supported staff by serving as liaison between pt's family and lab.  Pt family, and staff expressed appreciation for chaplain support.  Chaplain will refer this case to day chaplain.  11/29/11 0615  Clinical Encounter Type  Visited With Patient;Family;Health care provider  Visit Type Spiritual support (Code STEMI)  Referral From Other (Comment) (Code STEMI page)  Consult/Referral To Chaplain  Spiritual Encounters  Spiritual Needs Emotional;Prayer  Stress Factors  Patient Stress Factors Major life changes;Health changes  Family Stress Factors Major life changes  Advance Directives (For Healthcare)  Advance Directive Patient does not have advance directive    Verdie Shire, Chaplain [phone]

## 2011-11-29 NOTE — ED Notes (Signed)
Pt. Transported to Cath Lab.  Report given to Cath RNs'.

## 2011-11-29 NOTE — Care Management Note (Addendum)
    Page 1 of 1   12/04/2011     11:20:39 AM   CARE MANAGEMENT NOTE 12/04/2011  Patient:  Eddie Lewis, Eddie Lewis   Account Number:  [id number]  Date Initiated:  11/29/2011  Documentation initiated by:  Junius Creamer  Subjective/Objective Assessment:   adm w mi     Action/Plan:   lives w wife   Anticipated DC Date:     Anticipated DC Plan:        DC Planning Services  CM consult  Medication Assistance      Choice offered to / List presented to:             Status of service:   Medicare Important Message given?   (If response is "NO", the following Medicare IM given date fields will be blank) Date Medicare IM given:   Date Additional Medicare IM given:    Discharge Disposition:  HOME/SELF CARE  Per UR Regulation:  Reviewed for med. necessity/level of care/duration of stay  If discussed at Long Length of Stay Meetings, dates discussed:    Comments:  12/04/11-1006-J.Meliana Canner,RN,BSN [phone]     In to speak with patient regarding pending discharge on Brilinta. Free 30 day supply card given to patient. Discussed potential discounted co-pays with card also. No further needs identified. Anticipated discharge to home today.  9/3 10:30a debbie dowell rn,bsn for staged intervention post mi, poss intervention on 9-4.  8/30 10a debbie dowell rn,bsn [phone] pt on brilinta, willgive 30days free card and poss assist w copay for brilinta.has 75.00 coapy for brilinta. if he orders thru mail 90 supply is 80.00.

## 2011-11-29 NOTE — H&P (Signed)
Eddie Lewis is an 51 y.o. male.   Chief Complaint:  HPI: Patient is 51 year old male with past medical history significant for morbid obesity, tobacco abuse came to the ER by EMS complaining of retrosternal chest pain associated with numbness in both the arms associated with diaphoresis and mild shortness of breath which woke him up around 2:30 AM. Patient states he has been having chest pain off and on for last 2-3 days but did not seek any medical attention. EKG done on the field showed the ST elevation in anterolateral leads and a scrotal changes in infiltrate suggestive of acute anterolateral wall myocardial infarction code asSTE MI was called  History reviewed. No pertinent past medical history.  History reviewed. No pertinent past surgical history.  History reviewed. No pertinent family history. Social History:  does not have a smoking history on file. He does not have any smokeless tobacco history on file. He reports that he does not drink alcohol or use illicit drugs.  Allergies:  Allergies  Allergen Reactions  . Codeine     No prescriptions prior to admission    Results for orders placed during the hospital encounter of 11/29/11 (from the past 48 hour(s))  CBC     Status: Abnormal   Collection Time   11/29/11  3:50 AM      Component Value Range Comment   WBC 11.6 (*) 4.0 - 10.5 K/uL    RBC 4.64  4.22 - 5.81 MIL/uL    Hemoglobin 14.1  13.0 - 17.0 g/dL    HCT 96.0  45.4 - 09.8 %    MCV 89.2  78.0 - 100.0 fL    MCH 30.4  26.0 - 34.0 pg    MCHC 34.1  30.0 - 36.0 g/dL    RDW 11.9  14.7 - 82.9 %    Platelets 196  150 - 400 K/uL   DIFFERENTIAL     Status: Normal   Collection Time   11/29/11  3:50 AM      Component Value Range Comment   Neutrophils Relative 56  43 - 77 %    Neutro Abs 6.4  1.7 - 7.7 K/uL    Lymphocytes Relative 34  12 - 46 %    Lymphs Abs 4.0  0.7 - 4.0 K/uL    Monocytes Relative 8  3 - 12 %    Monocytes Absolute 0.9  0.1 - 1.0 K/uL    Eosinophils  Relative 2  0 - 5 %    Eosinophils Absolute 0.3  0.0 - 0.7 K/uL    Basophils Relative 0  0 - 1 %    Basophils Absolute 0.0  0.0 - 0.1 K/uL   PROTIME-INR     Status: Normal   Collection Time   11/29/11  3:50 AM      Component Value Range Comment   Prothrombin Time 13.6  11.6 - 15.2 seconds    INR 1.02  0.00 - 1.49   APTT     Status: Normal   Collection Time   11/29/11  3:50 AM      Component Value Range Comment   aPTT 26  24 - 37 seconds   BASIC METABOLIC PANEL     Status: Abnormal   Collection Time   11/29/11  3:50 AM      Component Value Range Comment   Sodium 139  135 - 145 mEq/L    Potassium 3.6  3.5 - 5.1 mEq/L    Chloride 104  96 -  112 mEq/L    CO2 25  19 - 32 mEq/L    Glucose, Bld 159 (*) 70 - 99 mg/dL    BUN 15  6 - 23 mg/dL    Creatinine, Ser 9.60  0.50 - 1.35 mg/dL    Calcium 8.9  8.4 - 45.4 mg/dL    GFR calc non Af Amer >90  >90 mL/min    GFR calc Af Amer >90  >90 mL/min   POCT I-STAT TROPONIN I     Status: Normal   Collection Time   11/29/11  3:55 AM      Component Value Range Comment   Troponin i, poc 0.01  0.00 - 0.08 ng/mL    Comment 3            POCT I-STAT, CHEM 8     Status: Abnormal   Collection Time   11/29/11  3:57 AM      Component Value Range Comment   Sodium 142  135 - 145 mEq/L    Potassium 3.6  3.5 - 5.1 mEq/L    Chloride 105  96 - 112 mEq/L    BUN 15  6 - 23 mg/dL    Creatinine, Ser 0.98  0.50 - 1.35 mg/dL    Glucose, Bld 119 (*) 70 - 99 mg/dL    Calcium, Ion 1.47 (*) 1.12 - 1.23 mmol/L    TCO2 23  0 - 100 mmol/L    Hemoglobin 14.3  13.0 - 17.0 g/dL    HCT 82.9  56.2 - 13.0 %    Dg Chest Portable 1 View  11/29/2011  *RADIOLOGY REPORT*  Clinical Data: STEMI  PORTABLE CHEST - 1 VIEW  Comparison: None.  Findings: Heart size within normal range. Mild right paratracheal/suprahilar fullness is likely vascular.  Mild left greater right lung base opacities.  Mild peribronchial thickening and reticulonodular interstitial prominence.  No pleural effusion  or pneumothorax.  No acute osseous finding.  IMPRESSION: Mild bibasilar opacities; likely atelectasis.  Peribronchial thickening and interstitial prominence is nonspecific.  May reflect acute or chronic bronchitic change.  Right paratracheal/suprahilar fullness.  Recommend attention with PA and lateral radiograph follow-up.   Original Report Authenticated By: Waneta Martins, M.D.     Review of Systems  Constitutional: Negative for fever, chills and weight loss.  HENT: Negative for hearing loss.   Eyes: Negative for blurred vision and double vision.  Respiratory: Negative for cough, hemoptysis and sputum production.   Cardiovascular: Positive for chest pain. Negative for palpitations, orthopnea, claudication and leg swelling.  Gastrointestinal: Negative for heartburn, nausea, vomiting and abdominal pain.  Neurological: Negative for dizziness and headaches.    Blood pressure 132/97, pulse 117, temperature 98.2 F (36.8 C), temperature source Oral, resp. rate 22, SpO2 99.00%. Physical Exam  Constitutional: He is oriented to person, place, and time. He appears well-developed and well-nourished.  HENT:  Head: Normocephalic and atraumatic.  Eyes: Pupils are equal, round, and reactive to light. Left eye exhibits no discharge.  Neck: Normal range of motion. Neck supple. No JVD present. No tracheal deviation present. No thyromegaly present.  Cardiovascular: Normal rate, regular rhythm and normal heart sounds.  Exam reveals no friction rub.   No murmur heard. Respiratory: Effort normal and breath sounds normal. He has no wheezes. He has no rales.  GI: Soft. Bowel sounds are normal. He exhibits no distension.  Musculoskeletal: He exhibits no edema and no tenderness.  Neurological: He is alert and oriented to person, place, and time.  Assessment/Plan Acute anterolateral wall myocardial infarction Morbid obesity Tobacco abuse Plan Discussed with patient and his wife were regarding  emergency left cath possible PTCA stenting its risk and benefits i.e. death MI stroke for emergency CABG local vascular complications etc. and consented for PCI. Azrael Huss N 11/29/2011, 5:39 AM

## 2011-11-29 NOTE — Progress Notes (Signed)
Sheath Removal note:  Right Femoral sheath removed @ 1335.  Site prepped per protocol.  Held manual pressure @ site 25 minutes.  Nurse assessed site post sheath removal and is currently Level "0" status.  Pt. tolerated procedure well.  Pressure dressing applied to site.  Post sheath instructions given to patient.

## 2011-11-30 LAB — CK TOTAL AND CKMB (NOT AT ARMC)
CK, MB: 46.1 ng/mL (ref 0.3–4.0)
Total CK: 1179 U/L — ABNORMAL HIGH (ref 7–232)

## 2011-11-30 LAB — BASIC METABOLIC PANEL
Chloride: 105 mEq/L (ref 96–112)
GFR calc Af Amer: >90 mL/min (ref >90)
Potassium: 4.5 mEq/L (ref 3.5–5.1)
Sodium: 140 mEq/L (ref 135–145)

## 2011-11-30 LAB — CBC
Platelets: 154 10*3/uL (ref 150–400)
RDW: 12.9 % (ref 11.5–15.5)
WBC: 9.8 10*3/uL (ref 4.0–10.5)

## 2011-11-30 LAB — TROPONIN I: Troponin I: 16.4 ng/mL (ref <0.30)

## 2011-11-30 LAB — LIPID PANEL: LDL Cholesterol: 76 mg/dL (ref 0–99)

## 2011-11-30 MED ORDER — ACETAMINOPHEN 325 MG PO TABS: 650.0000 mg | ORAL_TABLET | ORAL | Status: DC | PRN

## 2011-11-30 MED ORDER — ONDANSETRON HCL 4 MG/2ML IJ SOLN: 4.0000 mg | Freq: Four times a day (QID) | INTRAMUSCULAR | Status: DC | PRN

## 2011-11-30 MED ORDER — SODIUM CHLORIDE 0.9 % IV SOLN: INTRAVENOUS | Status: DC | PRN

## 2011-11-30 MED ORDER — ALPRAZOLAM 0.25 MG PO TABS
0.2500 mg | ORAL_TABLET | Freq: Two times a day (BID) | ORAL | Status: DC | PRN
  Filled 2011-11-30: qty 1

## 2011-11-30 NOTE — Cardiovascular Report (Signed)
NAME:  Eddie Lewis, Eddie Lewis NO.:  [id number]  MEDICAL RECORD NO.:  [id number]  LOCATION:  2913                         FACILITY:  [facility]  PHYSICIAN:  Eduardo Osier. Sharyn Lull, M.D. DATE OF BIRTH:  1960-05-23  DATE OF PROCEDURE:  11/29/2011 DATE OF DISCHARGE:                           CARDIAC CATHETERIZATION   PROCEDURE: 1. Left cardiac catheterization with selective left and right coronary     angiography, left ventriculography via right groin using Judkins     technique. 2. Successful percutaneous transluminal coronary angioplasty to 100%     occluded proximal left anterior descending artery using 2.5 x 12-mm     long Emerge balloon. 3. Successful deployment of 3.5 x 23-mm long Xience Xpedition drug-     eluting stent in proximal left anterior descending artery. 4. Successful postdilatation of this stent using 3.5 x 15-mm long Prince     Trek balloon going up to 15 atmospheric pressure. 5. Intracoronary infusion of 10 mg of t-PA in right coronary artery.  INDICATION FOR THE PROCEDURE:  Eddie Lewis is a 51 year old male with no significant past medical history except for morbid obesity and tobacco abuse.  He came to the ER by EMS complaining of retrosternal chest pain associated with numbness in both the arms associated with diaphoresis and mild shortness of breath, which woke him up around 2:30 a.m.  The patient states he has been having chest pain off and on for last 2-3 days, but did not seek any medical attention.  EKG done on the field showed normal sinus rhythm with ST elevation in the anterolateral leads and reciprocal changes in inferior leads suggestive of acute anterolateral wall myocardial infarction.  Code STEMI was called.  The patient denies any palpitation, lightheadedness, or syncope.  Denies history of PND, orthopnea, or leg swelling.  Discussed with the patient regarding his EKG finding and acute MI, and emergency left cath, possible PTCA and stenting, its  risks and benefits, i.e., death, MI, stroke, need for emergency CABG, local vascular complications, etc., and consented for PCI.  PROCEDURE:  After obtaining the informed consent, the patient was brought to the Cath Lab and was placed on fluoroscopy table.  Right groin was prepped and draped in usual fashion.  Xylocaine 1% was used for local anesthesia in the right groin.  With the help of thin wall needle, 6-French arterial sheath was placed.  The sheath was aspirated and flushed.  Next, 6-French right Judkins catheter was advanced over the wire under fluoroscopic guidance up to the ascending aorta.  Wire was pulled out, the catheter was aspirated and connected to the Manifold.  Catheter was further advanced and engaged into the right coronary ostium.  A single view of right coronary artery was obtained. Next, the catheter was disengaged and was pulled out over the wire and was replaced with 6-French 3.5XB LAD guiding catheter, which was advanced over the wire under fluoroscopic guidance up to the ascending aorta.  Wire was pulled out, the catheter was aspirated and connected to the Manifold.  Catheter was further advanced and engaged into left coronary ostium.  Multiple views of the left system were taken.  Next, the catheter was disengaged at the  end of the procedure and was pulled out over the wire and was replaced with 6-French pigtail catheter, which was advanced over the wire under fluoroscopic guidance up to the ascending aorta.  Catheter was further advanced across the aortic valve into the LV.  LV pressures were recorded.  Next, left ventriculography was done in 30-degree RAO position.  Post-angiographic pressures were recorded from LV and then pullback pressures were recorded from the aorta.  There was no gradient across the aortic valve.  Next, the pigtail catheter was pulled out over the wire.  Sheaths were aspirated and flushed.  FINDINGS:  LV showed mild anterolateral  wall hypokinesis in the visceral region.  EF of approximately 45-50%.  Left main was patent.  LAD was 100% occluded beyond proximal portion with TIMI 0 flow.  Left circumflex was large, which was patent, which tapers down in AV groove after giving off large OM 2.  OM 1 was small, which was patent.  OM 2 was large, which was patent.  RCA has 30% ostial and 20-30% proximal and mid- stenosis, and 75-80% distal stenosis with thrombus just prior to the bifurcation with PDA.  PDA and PLV branches were patent with TIMI grade 3 distal flow.  INTERVENTIONAL PROCEDURE:  Successful PTCA to proximal LAD was done using 2.5 x 12-mm long Emerge balloon for predilatation and then 3.5 x 23-mm long Xience Xpedition drug-eluting stent was deployed at 10 atmospheric pressure.  The stent was postdilated using 3.5 x 15-mm long Nisswa Trek balloon going up to 15 atmospheric pressure.  Lesion was dilated from 100% to 0% residual with excellent TIMI grade 3 distal flow without evidence of dissection or distal embolization.  The patient received weight-based Angiomax, 180 mg of Brilinta and weight-based Integrilin during the procedure.  Again at the end of the procedure, angiogram of right coronary artery was done, which showed persistent haziness and thrombus.  The patient received 10 mg of IV t-PA slowly through the guiding catheter with partial resolution of the thrombus.  The patient remained chest pain free and had TIMI grade 3 distal flow in RCA and LAD.  The patient tolerated the procedure well.  There were no complications.  Plan is to continue with Integrilin IV for 36 hours.  We will bring him back for relook angiogram early next week.     Eduardo Osier. Sharyn Lull, M.D.     MNH/MEDQ  D:  11/29/2011  T:  11/30/2011  Job:  [id number]

## 2011-11-30 NOTE — Progress Notes (Signed)
Subjective:  Doing well no chest pain or shortness of breath cardiac enzymes are trending down.  Objective:  Vital Signs in the last 24 hours: Temp:  [97.7 F (36.5 C)-98.5 F (36.9 C)] 98 F (36.7 C) (08/31 0700) Pulse Rate:  [71-94] 71  (08/31 0700) Resp:  [13-24] 16  (08/31 0000) BP: (91-132)/(63-109) 132/109 mmHg (08/31 0700) SpO2:  [92 %-99 %] 94 % (08/31 0700) Arterial Line BP: (108-123)/(71-78) 123/78 mmHg (08/30 1330)  Intake/Output from previous day: 08/30 0701 - 08/31 0700 In: 2540.6 [P.O.:600; I.V.:1940.6] Out: 3150 [Urine:3150] Intake/Output from this shift: Total I/O In: 36.5 [I.V.:36.5] Out: -   Physical Exam: Neck: no adenopathy, no carotid bruit, no JVD and supple, symmetrical, trachea midline Lungs: clear to auscultation bilaterally Heart: regular rate and rhythm, S1, S2 normal, no murmur, click, rub or gallop Abdomen: soft, non-tender; bowel sounds normal; no masses,  no organomegaly Extremities: extremities normal, atraumatic, no cyanosis or edema and Right groin stable with no evidence of hematoma or bruit  Lab Results:  Basename 11/30/11 0530 11/29/11 1227 11/29/11 0715  WBC 9.8 -- 10.8*  HGB 12.5* -- 13.1  PLT 154 189 --    Basename 11/30/11 0530 11/29/11 0715  NA 140 138  K 4.5 4.2  CL 105 105  CO2 29 24  GLUCOSE 128* 122*  BUN 10 13  CREATININE 0.81 0.72    Basename 11/30/11 0530 11/29/11 1812  TROPONINI 16.40* >20.00*   Hepatic Function Panel  Basename 11/29/11 0715  PROT 6.0  ALBUMIN 3.4*  AST 159*  ALT 38  ALKPHOS 44  BILITOT 0.3  BILIDIR --  IBILI --    Basename 11/30/11 0530  CHOL 133   No results found for this basename: PROTIME in the last 72 hours  Imaging: Imaging results have been reviewed and Dg Chest Portable 1 View  11/29/2011  *RADIOLOGY REPORT*  Clinical Data: STEMI  PORTABLE CHEST - 1 VIEW  Comparison: None.  Findings: Heart size within normal range. Mild right paratracheal/suprahilar fullness is likely  vascular.  Mild left greater right lung base opacities.  Mild peribronchial thickening and reticulonodular interstitial prominence.  No pleural effusion or pneumothorax.  No acute osseous finding.  IMPRESSION: Mild bibasilar opacities; likely atelectasis.  Peribronchial thickening and interstitial prominence is nonspecific.  May reflect acute or chronic bronchitic change.  Right paratracheal/suprahilar fullness.  Recommend attention with PA and lateral radiograph follow-up.   Original Report Authenticated By: Waneta Martins, M.D.     Cardiac Studies:  Assessment/Plan:  Acute anterolateral wall myocardial infarction status post PCI 100% occluded LAD with excellent results and intra-coronary TPA and right coronary artery infusion  Morbid obesity  Tobacco abuse  Hypercholesteremia  Glucose intolerance Plan Check labs in a.m. Increase ambulation Will schedule for relook angiogram early next week  LOS: 1 day    Luman Holway N 11/30/2011, 10:45 AM

## 2011-11-30 NOTE — Progress Notes (Signed)
CARDIAC REHAB PHASE I   PRE:  Rate/Rhythm: 79 SR  BP:  Supine: 98/63  Sitting:   Standing:    SaO2: 99% RA  MODE:  Ambulation: 350 ft   POST:  Rate/Rhythm: 78 SR  BP:  Supine:   Sitting: 110/79  Standing:    SaO2: 97% RA 1137-1200 Pt tolerated ambulation well with assist x 1. Gait steady, no c/o, VSS. To bed after walk.  Eddie Lewis

## 2011-12-01 LAB — CBC
HCT: 38.8 % — ABNORMAL LOW (ref 39.0–52.0)
MCHC: 33.5 g/dL (ref 30.0–36.0)
RDW: 12.6 % (ref 11.5–15.5)

## 2011-12-01 LAB — BASIC METABOLIC PANEL
BUN: 11 mg/dL (ref 6–23)
Calcium: 8.9 mg/dL (ref 8.4–10.5)
GFR calc Af Amer: >90 mL/min (ref >90)
GFR calc non Af Amer: >90 mL/min (ref >90)
Potassium: 3.7 mEq/L (ref 3.5–5.1)
Sodium: 137 mEq/L (ref 135–145)

## 2011-12-01 LAB — CK TOTAL AND CKMB (NOT AT ARMC)
CK, MB: 8.8 ng/mL (ref 0.3–4.0)
Relative Index: 1.5 (ref 0.0–2.5)

## 2011-12-01 LAB — TROPONIN I: Troponin I: 8.92 ng/mL (ref <0.30)

## 2011-12-01 NOTE — Progress Notes (Signed)
Subjective:  Patient denies any chest pain or shortness of breath  Objective:  Vital Signs in the last 24 hours: Temp:  [98.2 F (36.8 C)-98.7 F (37.1 C)] 98.6 F (37 C) (09/01 0722) Pulse Rate:  [76-77] 77  (08/31 1155) Resp:  [18-20] 20  (09/01 0400) BP: (98-113)/(53-79) 113/72 mmHg (09/01 0720) SpO2:  [93 %-97 %] 95 % (09/01 0400)  Intake/Output from previous day: 08/31 0701 - 09/01 0700 In: 586.5 [P.O.:420; I.V.:166.5] Out: 900 [Urine:900] Intake/Output from this shift:    Physical Exam: Neck: no adenopathy, no carotid bruit, no JVD and supple, symmetrical, trachea midline Lungs: clear to auscultation bilaterally Heart: regular rate and rhythm, S1, S2 normal, no murmur, click, rub or gallop Abdomen: soft, non-tender; bowel sounds normal; no masses,  no organomegaly Extremities: extremities normal, atraumatic, no cyanosis or edema and Right groin stable  Lab Results:  Basename 12/01/11 0545 11/30/11 0530  WBC 9.9 9.8  HGB 13.0 12.5*  PLT 168 154    Basename 12/01/11 0545 11/30/11 0530  NA 137 140  K 3.7 4.5  CL 104 105  CO2 24 29  GLUCOSE 151* 128*  BUN 11 10  CREATININE 0.75 0.81    Basename 12/01/11 0545 11/30/11 0530  TROPONINI 8.92* 16.40*   Hepatic Function Panel  Basename 11/29/11 0715  PROT 6.0  ALBUMIN 3.4*  AST 159*  ALT 38  ALKPHOS 44  BILITOT 0.3  BILIDIR --  IBILI --    Basename 11/30/11 0530  CHOL 133   No results found for this basename: PROTIME in the last 72 hours  Imaging: Imaging results have been reviewed and No results found.  Cardiac Studies:  Assessment/Plan:  Acute anterolateral wall myocardial infarction status post PCI 100% occluded LAD with excellent results and intra-coronary TPA and right coronary artery infusion  Morbid obesity  Tobacco abuse  Hypercholesteremia  Glucose intolerance  Plan Continue present management scheduled for left cath possible PCI on Tuesday  LOS: 2 days    Eddie Lewis  N 12/01/2011, 9:47 AM

## 2011-12-02 MED ORDER — SODIUM CHLORIDE 0.9 % IV SOLN: 250.0000 mL | INTRAVENOUS | Status: DC | PRN

## 2011-12-02 MED ORDER — SODIUM CHLORIDE 0.9 % IJ SOLN
3.0000 mL | Freq: Two times a day (BID) | INTRAMUSCULAR | Status: DC
  Administered 2011-12-02 – 2011-12-03 (×2): 3 mL via INTRAVENOUS

## 2011-12-02 MED ORDER — SODIUM CHLORIDE 0.9 % IV SOLN
INTRAVENOUS | Status: DC
  Administered 2011-12-02: 1000 mL via INTRAVENOUS

## 2011-12-02 MED ORDER — SODIUM CHLORIDE 0.9 % IJ SOLN: 3.0000 mL | INTRAMUSCULAR | Status: DC | PRN

## 2011-12-02 MED FILL — Dextrose Inj 5%: INTRAVENOUS | Qty: 50 | Status: AC

## 2011-12-02 NOTE — Progress Notes (Signed)
Subjective:  Doing well denies any chest pain or shortness of breath  Objective:  Vital Signs in the last 24 hours: Temp:  [98.2 F (36.8 C)-98.9 F (37.2 C)] 98.4 F (36.9 C) (09/02 0800) Resp:  [16-20] 16  (09/02 0500) BP: (93-115)/(55-72) 108/65 mmHg (09/02 0824) SpO2:  [94 %-96 %] 96 % (09/02 0824)  Intake/Output from previous day: 09/01 0701 - 09/02 0700 In: 300 [P.O.:300] Out: -  Intake/Output from this shift: Total I/O In: 360 [P.O.:360] Out: -   Physical Exam: Neck: no adenopathy, no carotid bruit, no JVD and supple, symmetrical, trachea midline Lungs: clear to auscultation bilaterally Heart: regular rate and rhythm, S1, S2 normal, no murmur, click, rub or gallop Abdomen: soft, non-tender; bowel sounds normal; no masses,  no organomegaly Extremities: extremities normal, atraumatic, no cyanosis or edema and Right groin stable  Lab Results:  Basename 12/01/11 0545 11/30/11 0530  WBC 9.9 9.8  HGB 13.0 12.5*  PLT 168 154    Basename 12/01/11 0545 11/30/11 0530  NA 137 140  K 3.7 4.5  CL 104 105  CO2 24 29  GLUCOSE 151* 128*  BUN 11 10  CREATININE 0.75 0.81    Basename 12/01/11 0545 11/30/11 0530  TROPONINI 8.92* 16.40*   Hepatic Function Panel No results found for this basename: PROT,ALBUMIN,AST,ALT,ALKPHOS,BILITOT,BILIDIR,IBILI in the last 72 hours  Basename 11/30/11 0530  CHOL 133   No results found for this basename: PROTIME in the last 72 hours  Imaging: Imaging results have been reviewed and No results found.  Cardiac Studies:  Assessment/Plan:  Acute anterolateral wall myocardial infarction status post PCI 100% occluded LAD with excellent results and intra-coronary TPA  right coronary artery infusion  Morbid obesity  Tobacco abuse  Hypercholesteremia  Glucose intolerance  Plan Continue present management Schedule for left cath possible PTCA stenting tomorrow. Discussed with patient regarding risk and benefits i.e. death MI stroke need  for emergency CABG local last complications risk of restenosis etc. and consents for PCI  LOS: 3 days    Eddie Lewis N 12/02/2011, 11:39 AM

## 2011-12-03 ENCOUNTER — Encounter (HOSPITAL_COMMUNITY)
Admission: EM | Disposition: A | Discharge status: Home / Self Care | Payer: Self-pay | Source: Home / Self Care | Attending: Cardiology

## 2011-12-03 ENCOUNTER — Encounter (HOSPITAL_COMMUNITY): Payer: Self-pay

## 2011-12-03 HISTORY — PX: LEFT HEART CATHETERIZATION WITH CORONARY ANGIOGRAM: SHX5451

## 2011-12-03 LAB — PROTIME-INR: Prothrombin Time: 14 seconds (ref 11.6–15.2)

## 2011-12-03 SURGERY — LEFT HEART CATHETERIZATION WITH CORONARY ANGIOGRAM
Anesthesia: LOCAL

## 2011-12-03 MED ORDER — MIDAZOLAM HCL 2 MG/2ML IJ SOLN
INTRAMUSCULAR | Status: AC
  Filled 2011-12-03: qty 2

## 2011-12-03 MED ORDER — HEPARIN (PORCINE) IN NACL 2-0.9 UNIT/ML-% IJ SOLN
INTRAMUSCULAR | Status: AC
  Filled 2011-12-03: qty 2000

## 2011-12-03 MED ORDER — SODIUM CHLORIDE 0.9 % IV SOLN
0.2500 mg/kg/h | INTRAVENOUS | Status: AC
  Filled 2011-12-03: qty 250

## 2011-12-03 MED ORDER — ASPIRIN 81 MG PO CHEW
324.0000 mg | CHEWABLE_TABLET | ORAL | Status: AC
  Administered 2011-12-03: 324 mg via ORAL
  Filled 2011-12-03: qty 4

## 2011-12-03 MED ORDER — SODIUM CHLORIDE 0.9 % IV SOLN
INTRAVENOUS | Status: AC
  Administered 2011-12-03: 13:00:00 via INTRAVENOUS

## 2011-12-03 MED ORDER — TICAGRELOR 90 MG PO TABS: 90.0000 mg | ORAL_TABLET | Freq: Two times a day (BID) | ORAL | Status: DC

## 2011-12-03 MED ORDER — ACETAMINOPHEN 325 MG PO TABS: 650.0000 mg | ORAL_TABLET | ORAL | Status: DC | PRN

## 2011-12-03 MED ORDER — ASPIRIN 81 MG PO CHEW
81.0000 mg | CHEWABLE_TABLET | Freq: Every day | ORAL | Status: DC
  Administered 2011-12-04: 09:00:00 81 mg via ORAL
  Filled 2011-12-03: qty 1

## 2011-12-03 MED ORDER — BIVALIRUDIN 250 MG IV SOLR
INTRAVENOUS | Status: AC
  Filled 2011-12-03: qty 250

## 2011-12-03 MED ORDER — ZOLPIDEM TARTRATE 5 MG PO TABS
5.0000 mg | ORAL_TABLET | Freq: Once | ORAL | Status: AC
  Administered 2011-12-03: 23:00:00 5 mg via ORAL
  Filled 2011-12-03: qty 1

## 2011-12-03 MED ORDER — LIDOCAINE HCL (PF) 1 % IJ SOLN
INTRAMUSCULAR | Status: AC
  Filled 2011-12-03: qty 30

## 2011-12-03 MED ORDER — TICAGRELOR 90 MG PO TABS
ORAL_TABLET | ORAL | Status: AC
  Administered 2011-12-04: 09:00:00 90 mg via ORAL
  Filled 2011-12-03: qty 1

## 2011-12-03 MED ORDER — DIAZEPAM 5 MG PO TABS
5.0000 mg | ORAL_TABLET | ORAL | Status: AC
  Administered 2011-12-03: 5 mg via ORAL
  Filled 2011-12-03: qty 1

## 2011-12-03 MED ORDER — NITROGLYCERIN 0.2 MG/ML ON CALL CATH LAB
INTRAVENOUS | Status: AC
  Filled 2011-12-03: qty 1

## 2011-12-03 MED ORDER — FENTANYL CITRATE 0.05 MG/ML IJ SOLN
INTRAMUSCULAR | Status: AC
  Filled 2011-12-03: qty 2

## 2011-12-03 MED ORDER — ONDANSETRON HCL 4 MG/2ML IJ SOLN: 4.0000 mg | Freq: Four times a day (QID) | INTRAMUSCULAR | Status: DC | PRN

## 2011-12-03 NOTE — Progress Notes (Addendum)
CARDIAC REHAB PHASE I   PRE:  Rate/Rhythm: 75 SR    BP: sitting 103/64    SaO2:   MODE:  Ambulation: 700 ft   POST:  Rate/Rhythm: 94 SR    BP: sitting 121/77     SaO2:   Tolerated well, no c/o. Finished ed, mother present. Pt voiced understanding. Wants to make change. Sts he is going to have to have discussion with his boss about decreasing stress/work hours. Sts he wants to quit smoking and quit for four years using zyban. Would like to try this again. Gave resources for tobacco counseling. [phone]  Harriet Masson CES, ACSM

## 2011-12-03 NOTE — H&P (Signed)
  No change in the H&P as before

## 2011-12-03 NOTE — CV Procedure (Signed)
Left cath/PTCA stenting report dictated on 12/03/2011 dictation number is [id number]

## 2011-12-03 NOTE — Progress Notes (Signed)
Site area: right groin  Site Prior to Removal:  Level 0  Pressure Applied For 20 MINUTES    Minutes Beginning at 1525  Manual:   yes  Patient Status During Pull:  stable  Post Pull Groin Site:  Level 0  Post Pull Instructions Given:  yes  Post Pull Pulses Present:  yes  Dressing Applied:  yes  Comments:

## 2011-12-04 LAB — TROPONIN I: Troponin I: 2.19 ng/mL (ref <0.30)

## 2011-12-04 LAB — BASIC METABOLIC PANEL
CO2: 27 mEq/L (ref 19–32)
Calcium: 9.2 mg/dL (ref 8.4–10.5)
Chloride: 105 mEq/L (ref 96–112)
Glucose, Bld: 113 mg/dL — ABNORMAL HIGH (ref 70–99)
Sodium: 141 mEq/L (ref 135–145)

## 2011-12-04 LAB — CK TOTAL AND CKMB (NOT AT ARMC)
Relative Index: 1.5 (ref 0.0–2.5)
Total CK: 225 U/L (ref 7–232)

## 2011-12-04 LAB — CBC
Hemoglobin: 14.3 g/dL (ref 13.0–17.0)
MCH: 29.7 pg (ref 26.0–34.0)
MCV: 87.9 fL (ref 78.0–100.0)
RBC: 4.81 MIL/uL (ref 4.22–5.81)
WBC: 9.5 10*3/uL (ref 4.0–10.5)

## 2011-12-04 MED ORDER — TICAGRELOR 90 MG PO TABS: 90.0000 mg | ORAL_TABLET | Freq: Two times a day (BID) | ORAL | Status: DC

## 2011-12-04 MED ORDER — ATORVASTATIN CALCIUM 80 MG PO TABS: 80.0000 mg | ORAL_TABLET | Freq: Every day | ORAL | Status: DC

## 2011-12-04 MED ORDER — METOPROLOL TARTRATE 12.5 MG HALF TABLET: 12.5000 mg | ORAL_TABLET | Freq: Two times a day (BID) | ORAL | Status: DC

## 2011-12-04 MED ORDER — ASPIRIN 81 MG PO CHEW: 81.0000 mg | CHEWABLE_TABLET | Freq: Every day | ORAL | Status: AC

## 2011-12-04 MED ORDER — NITROGLYCERIN 0.4 MG SL SUBL: 0.4000 mg | SUBLINGUAL_TABLET | SUBLINGUAL | Status: DC | PRN

## 2011-12-04 MED FILL — Dextrose Inj 5%: INTRAVENOUS | Qty: 50 | Status: AC

## 2011-12-04 NOTE — Progress Notes (Signed)
Pt requesting sleeping pill for tonight.  Dr. Sharyn Lull notified.  Order received.

## 2011-12-04 NOTE — Discharge Summary (Signed)
  Discharge summary dictated on 12/04/2011 dictation [id number]

## 2011-12-04 NOTE — Progress Notes (Signed)
CARDIAC REHAB PHASE I   PRE:  Rate/Rhythm: 84SR  BP:  Supine:   Sitting: 120/80  Standing:    SaO2:   MODE:  Ambulation: 600 ft   POST:  Rate/Rhythem: 87SR  BP:  Supine:   Sitting: 120/74  Standing:    SaO2:  4098-1191 Pt walked 600 ft with steady gait. Tolerated well. Denied CP. Answered questions pt had about diet and ex. 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 Told pt to discuss with Dr.Harwani. Discussed MI restrictions. Referring to [place] Phase 2.  Duanne Limerick

## 2011-12-04 NOTE — Cardiovascular Report (Signed)
NAME:  Eddie Lewis, Eddie Lewis NO.:  [id number]  MEDICAL RECORD NO.:  [id number]  LOCATION:  [facility]                         FACILITY:  [facility]  PHYSICIAN:  Jacqueleen Pulver N. Sharyn Lull, M.D. DATE OF BIRTH:  1960-07-23  DATE OF PROCEDURE:  12/03/2011 DATE OF DISCHARGE:                           CARDIAC CATHETERIZATION   PROCEDURES: 1. Left cardiac catheterization with selective left and right coronary     angiography via right groin using Judkins technique. 2. Successful percutaneous transluminal coronary angioplasty to distal     right coronary artery using 2.5 x 8 mm long Emerge balloon for     predilatation. 3. Successful deployment of 3.5 x 12 mm long Xience Xpedition drug-     eluting stent in distal right coronary artery at 10 atmospheric     pressures and successful postdilatation of this stent using same     balloon going up to 15 atmospheric pressures.  INDICATION FOR THE PROCEDURE:  Eddie Lewis is a 52 year old with past medical history significant for recent anterolateral wall myocardial infarction, presented as STEMI, requiring emergency PTCA stenting to proximal LAD with excellent resulted, was noted to have ulcerated plaque in distal RCA with thrombus which was treated by IIb/IIIa inhibitors and intracoronary thrombolytic therapy, morbid obesity, tobacco abuse.  Was admitted on November 29, 2011, with TIMI requiring emergency PCI to LAD. The patient is brought to the cath lab for relook angiogram and possible PCI to distal RCA.  Discussed with the patient at length regarding risks and benefits, i.e., death, MI, stroke, need for emergency CABG, risk of restenosis, local vascular complications, risk of distal embolization, etc., and consented for the procedure.  PROCEDURE IN DETAIL:  After obtaining the informed consent, the patient was brought to the Cath Lab and was placed on fluoroscopy table.  Right groin was prepped and draped in usual fashion.  Xylocaine 1% was  used for local anesthesia in the right groin.  With the help of thin wall needle, 6-French arterial sheath was placed.  The sheath was aspirated and flushed.  Next, 6-French left Judkins catheter was advanced over the wire under fluoroscopic guidance up to the ascending aorta.  Wire was pulled out.  The catheter was aspirated and connected to the Manifold. Catheter was further advanced and engaged into left coronary ostium. Multiple views of the left system were taken.  Next, catheter was disengaged and was pulled out over the wire and was replaced with 6- [place] right Judkins catheter, which was advanced over the wire under fluoroscopic guidance up to the ascending aorta.  Wire was pulled out, the catheter was aspirated, and connected to the Manifold.  Catheter was further advanced and engaged into right coronary ostium.  A single view of right coronary artery was obtained.  FINDINGS:  LAD was patent at prior PTCA and stented site with excellent TIMI grade 3 distal flow.  RCA has distal 70% stenosis with ulcerated plaque and haziness with TIMI grade 3 distal flow.  INTERVENTIONAL PROCEDURE:  Successful PTCA to distal RCA was done using 2.5 x 8 mm long Emerge balloon for predilatation and then 3.5 x 12 mm long Xience drug-eluting stent was deployed in distal RCA at 10  atmospheric pressure.  The stent was postdilated using the same balloon going up to 15 atmospheric pressure and lesion dilated from 70% with ulcerated plaque and haziness to 0% ostial with excellent TIMI grade 3 distal flow without evidence of dissection or distal embolization.  The patient received weight-based Angiomax and total of 180 of Brilinta prior to the procedure.  The patient tolerated the procedure well.  There were no complications.  The patient was transferred to recovery room in stable condition.     Eddie Lewis. Sharyn Lull, M.D.     [facility]/MEDQ  D:  12/03/2011  T:  12/04/2011  Job:  [id number]

## 2011-12-05 NOTE — Discharge Summary (Signed)
NAME:  Eddie Lewis, Eddie Lewis NO.:  [id number]  MEDICAL RECORD NO.:  [id number]  LOCATION:  6524                         FACILITY:  [facility]  PHYSICIAN:  Eduardo Osier. Sharyn Lull, M.D. DATE OF BIRTH:  09/20/1960  DATE OF ADMISSION:  11/29/2011 DATE OF DISCHARGE:  12/04/2011                              DISCHARGE SUMMARY   ADMITTING DIAGNOSES: 1. Acute anterolateral wall myocardial infarction. 2. Morbid obesity. 3. Tobacco abuse.  FINAL DIAGNOSES: 1. Acute anterolateral wall myocardial infarction, status post     emergency percutaneous transluminal coronary angioplasty, stenting     to 100% occluded left anterior descending artery. 2. Two-vessel coronary artery disease, status post left cath and     percutaneous transluminal coronary angioplasty, stenting to distal     right coronary artery. 3. Hypertension. 4. Hypercholesteremia. 5. Morbid obesity. 6. Tobacco abuse. 7. Glucose intolerance.  DISCHARGE HOME MEDICATIONS: 1. Enteric-coated aspirin 81 mg 1 tablet daily. 2. Atorvastatin 80 mg 1 tablet daily. 3. Metoprolol tartrate 12.5 mg twice daily. 4. Nitrostat 0.4 mg use as directed. 5. Brilinta 90 mg 1 tablet twice daily.  DIET:  Low salt, low cholesterol.  ACTIVITY:  Increase activity slowly as tolerated.  Post-PTCA stent instructions have been given.  The patient has been advised regarding smoking cessation and lifestyle modification.  We will add ACE inhibitors as blood pressure tolerates as outpatient.  CONDITION AT DISCHARGE:  Stable.  The patient will be scheduled for phase 2 cardiac rehab as outpatient.  BRIEF HISTORY AND HOSPITAL COURSE:  Eddie Lewis is a 51 year old male with past medical history significant for morbid obesity, tobacco abuse, he came to the ER by EMS complaining of retrosternal chest pain associated with numbness in both arms associated with diaphoresis and mild shortness of breath, which woke him up around 2:30 a.m.  The patient states  he has been having chest pain off and on for last 2-3 days, but did not seek any medical attention.  EKG done on the field showed normal sinus rhythm with ST elevation in anterolateral leads and reciprocal changes in inferior leads suggestive of acute anterolateral wall myocardial infarction.  Code STEMI was called.  PAST MEDICAL HISTORY:  As above.  PAST SURGICAL HISTORY:  None.  PHYSICAL EXAMINATION:  VITAL SIGNS:  His blood pressure was 132/97, he was tachycardic, heart rate was 70s.  He was afebrile. HEENT:  Conjunctiva was pink. NECK:  Supple.  No JVD.  No bruit. LUNGS:  Clear to auscultation without rhonchi or rales. CARDIOVASCULAR:  S1, S2 was normal.  There was no S3 or S4 gallop. ABDOMEN:  Soft.  Bowel sounds were present, obese, nontender. EXTREMITIES:  There was no clubbing, cyanosis, or edema.  LABORATORY DATA:  His sodium was 139, potassium 3.6, BUN 15, creatinine 0.81.  Hemoglobin was 14.1, hematocrit 41.4.  His cardiac enzymes; first set, troponin-I was more than 20, next two sets were more than 20, fourth set troponin-I 16.40.  Yesterday was 8.92, today is 2.19.  His CK was 1179, MB 46.  Next set, CK 595, MB 8.8.  Next set, CK 225, MB 3.4. His labs today; sodium 141, potassium 4.1, BUN 12, creatinine 0.8.  His glucose is  113.  Hemoglobin is 14.3, hematocrit 42.3, white count of 9.5.  His hemoglobin A1c was 5.9.  EKG this morning showed normal sinus rhythm with evolving anteroseptal wall myocardial infarction.  BRIEF HOSPITAL COURSE:  The patient was directly brought to the Cath Lab from the ER and underwent emergency PTCA stenting to 100% occluded proximal LAD.  As per procedure report, the patient tolerated the procedure well.  The patient was noted to have ulcerated plaque with thrombus and distal RCA just prior to the bifurcation with PDA and PLV branches.  The patient was treated with IV IIb/IIIa inhibitors and subsequently received intracoronary tPA with  improvement in thrombus burden, but persistent haziness.  The patient had continued TIMI-3 distal flow.  The patient's chest pain completely resolved after PCI to LAD.  Postprocedure, the patient did not have any chest pains during the hospital stay.  The patient was given prolonged infusion of Integrilin and subsequently brought back to the lab yesterday for relook angiogram and underwent PTCA stenting to distal RCA as per procedure report.  The patient was noted to have patent LAD stent with excellent results.  The patient did not have any further episodes of chest pain during the hospital stay.  His groin is stable with no evidence of hematoma or bruit.  Phase 1 cardiac rehab was called.  The patient has been ambulating in the hallway without any problems.  The patient will be discharged home on above medications and will be followed up in my office in 1 week.     Eduardo Osier. Sharyn Lull, M.D.    [facility]/MEDQ  D:  12/04/2011  T:  12/05/2011  Job:  [id number]

## 2012-01-07 ENCOUNTER — Encounter (HOSPITAL_COMMUNITY): Payer: Self-pay

## 2012-01-07 ENCOUNTER — Encounter (HOSPITAL_COMMUNITY)
Admission: RE | Admit: 2012-01-07 | Discharge: 2012-01-07 | Disposition: A | Discharge status: Home / Self Care | Payer: PRIVATE HEALTH INSURANCE | Source: Ambulatory Visit | Attending: Cardiology | Admitting: Cardiology

## 2012-01-07 DIAGNOSIS — I252 Old myocardial infarction: Secondary | ICD-10-CM | POA: Insufficient documentation

## 2012-01-07 DIAGNOSIS — Z5189 Encounter for other specified aftercare: Secondary | ICD-10-CM | POA: Insufficient documentation

## 2012-01-07 NOTE — Progress Notes (Signed)
Patient was referred by Dr. Sharyn Lull due to recent Myocardial Infraction 410.90 and Stent Placement V45.82. During orientation advised patient on arrival and appointment times what to wear, what to do before, during and after exercise. Reviewed attendance and class policy. Talked about inclement weather and class consultation policy. Pt is scheduled to start Cardiac Rehab on 01/13/12 at 6:45 am. Pt was advised to come to class 5 minutes before class starts. He was also given instructions on meeting with the dietician and attending the Family Structure classes. Pt is eager to get started.

## 2012-01-07 NOTE — Patient Instructions (Signed)
Pt has finished orientation and is scheduled to start CR on 01/13/12 at 6:45 am. Pt has been instructed to arrive to class 15 minutes early for scheduled class. Pt has been instructed to wear comfortable clothing and shoes with rubber soles. Pt has been told to take their medications 1 hour prior to coming to class.  If the patient is not going to attend class, he/she has been instructed to call.

## 2012-01-13 ENCOUNTER — Encounter (HOSPITAL_COMMUNITY): Payer: PRIVATE HEALTH INSURANCE

## 2012-01-15 ENCOUNTER — Encounter (HOSPITAL_COMMUNITY): Payer: PRIVATE HEALTH INSURANCE

## 2012-01-16 ENCOUNTER — Encounter (HOSPITAL_COMMUNITY)
Admission: RE | Admit: 2012-01-16 | Discharge: 2012-01-16 | Disposition: A | Discharge status: Home / Self Care | Payer: PRIVATE HEALTH INSURANCE | Source: Ambulatory Visit | Attending: Cardiology | Admitting: Cardiology

## 2012-01-16 NOTE — Progress Notes (Signed)
Cardiac Rehab Medication Review by a Pharmacist  Does the patient  feel that his/her medications are working for him/her?  yes  Has the patient been experiencing any side effects to the medications prescribed?  no  Does the patient measure his/her own blood pressure or blood glucose at home?  yes   Does the patient have any problems obtaining medications due to transportation or finances?   no  Understanding of regimen: excellent Understanding of indications: excellent Potential of compliance: excellent    Pharmacist comments: None    Laurence Slate 01/16/2012 8:52 AM

## 2012-01-17 ENCOUNTER — Encounter (HOSPITAL_COMMUNITY): Payer: PRIVATE HEALTH INSURANCE

## 2012-01-20 ENCOUNTER — Encounter (HOSPITAL_COMMUNITY): Admission: RE | Admit: 2012-01-20 | Payer: PRIVATE HEALTH INSURANCE | Source: Ambulatory Visit

## 2012-01-20 ENCOUNTER — Encounter (HOSPITAL_COMMUNITY): Payer: PRIVATE HEALTH INSURANCE

## 2012-01-22 ENCOUNTER — Encounter (HOSPITAL_COMMUNITY): Payer: PRIVATE HEALTH INSURANCE

## 2012-01-22 ENCOUNTER — Encounter (HOSPITAL_COMMUNITY)
Admission: RE | Admit: 2012-01-22 | Discharge: 2012-01-22 | Disposition: A | Discharge status: Home / Self Care | Payer: PRIVATE HEALTH INSURANCE | Source: Ambulatory Visit | Attending: Cardiology | Admitting: Cardiology

## 2012-01-23 NOTE — Progress Notes (Signed)
Pt started cardiac rehab today.  Pt tolerated light exercise without difficulty. Telemetry Sinus Rhythm. Will continue to monitor the patient throughout  the program.

## 2012-01-24 ENCOUNTER — Encounter (HOSPITAL_COMMUNITY)
Admission: RE | Admit: 2012-01-24 | Discharge: 2012-01-24 | Disposition: A | Discharge status: Home / Self Care | Payer: PRIVATE HEALTH INSURANCE | Source: Ambulatory Visit | Attending: Cardiology | Admitting: Cardiology

## 2012-01-24 ENCOUNTER — Encounter (HOSPITAL_COMMUNITY): Payer: PRIVATE HEALTH INSURANCE

## 2012-01-24 NOTE — Progress Notes (Signed)
Eddie Lewis  Reported having chest discomfort last night he did not take sublingual  Nitroglycerin but he did call Dr Sharyn Lull last night. [name] reported having two episodes today of chest discomfort which resolved after rest.  [name] said he had some discomfort today around one o'clock.  [name] denies having any chest discomfort currently. Dr Annitta Jersey office called and notified.  [name] did not exercise today.  [name] went to Dr Annitta Jersey office to be evaluated.

## 2012-01-27 ENCOUNTER — Encounter (HOSPITAL_COMMUNITY): Payer: PRIVATE HEALTH INSURANCE

## 2012-01-27 ENCOUNTER — Encounter (HOSPITAL_COMMUNITY)
Admission: RE | Admit: 2012-01-27 | Discharge: 2012-01-27 | Disposition: A | Discharge status: Home / Self Care | Payer: PRIVATE HEALTH INSURANCE | Source: Ambulatory Visit | Attending: Cardiology | Admitting: Cardiology

## 2012-01-29 ENCOUNTER — Encounter (HOSPITAL_COMMUNITY): Payer: PRIVATE HEALTH INSURANCE

## 2012-01-29 ENCOUNTER — Encounter (HOSPITAL_COMMUNITY)
Admission: RE | Admit: 2012-01-29 | Discharge: 2012-01-29 | Disposition: A | Discharge status: Home / Self Care | Payer: PRIVATE HEALTH INSURANCE | Source: Ambulatory Visit | Attending: Cardiology | Admitting: Cardiology

## 2012-01-29 NOTE — Progress Notes (Signed)
Reviewed home exercise with pt today.  Pt plans to walk at home for exercise.  Reviewed THR, pulse, RPE, sign and symptoms, NTG use, and when to call 911 or MD.  Pt voiced understanding. Tamella Tuccillo, MA, ACSM RCEP   

## 2012-01-31 ENCOUNTER — Encounter (HOSPITAL_COMMUNITY): Payer: PRIVATE HEALTH INSURANCE

## 2012-01-31 ENCOUNTER — Encounter (HOSPITAL_COMMUNITY)
Admission: RE | Admit: 2012-01-31 | Discharge: 2012-01-31 | Disposition: A | Discharge status: Home / Self Care | Payer: PRIVATE HEALTH INSURANCE | Source: Ambulatory Visit | Attending: Cardiology | Admitting: Cardiology

## 2012-01-31 DIAGNOSIS — Z5189 Encounter for other specified aftercare: Secondary | ICD-10-CM | POA: Insufficient documentation

## 2012-01-31 DIAGNOSIS — I252 Old myocardial infarction: Secondary | ICD-10-CM | POA: Insufficient documentation

## 2012-02-03 ENCOUNTER — Encounter (HOSPITAL_COMMUNITY): Payer: PRIVATE HEALTH INSURANCE

## 2012-02-05 ENCOUNTER — Encounter (HOSPITAL_COMMUNITY): Payer: PRIVATE HEALTH INSURANCE

## 2012-02-07 ENCOUNTER — Encounter (HOSPITAL_COMMUNITY): Payer: PRIVATE HEALTH INSURANCE

## 2012-02-07 ENCOUNTER — Encounter (HOSPITAL_COMMUNITY)
Admission: RE | Admit: 2012-02-07 | Discharge: 2012-02-07 | Disposition: A | Discharge status: Home / Self Care | Payer: PRIVATE HEALTH INSURANCE | Source: Ambulatory Visit | Attending: Cardiology | Admitting: Cardiology

## 2012-02-10 ENCOUNTER — Encounter (HOSPITAL_COMMUNITY): Payer: PRIVATE HEALTH INSURANCE

## 2012-02-10 ENCOUNTER — Encounter (HOSPITAL_COMMUNITY)
Admission: RE | Admit: 2012-02-10 | Discharge: 2012-02-10 | Disposition: A | Discharge status: Home / Self Care | Payer: PRIVATE HEALTH INSURANCE | Source: Ambulatory Visit | Attending: Cardiology | Admitting: Cardiology

## 2012-02-12 ENCOUNTER — Encounter (HOSPITAL_COMMUNITY): Payer: PRIVATE HEALTH INSURANCE

## 2012-02-14 ENCOUNTER — Encounter (HOSPITAL_COMMUNITY)
Admission: RE | Admit: 2012-02-14 | Discharge: 2012-02-14 | Disposition: A | Discharge status: Home / Self Care | Payer: PRIVATE HEALTH INSURANCE | Source: Ambulatory Visit | Attending: Cardiology | Admitting: Cardiology

## 2012-02-14 ENCOUNTER — Encounter (HOSPITAL_COMMUNITY): Payer: PRIVATE HEALTH INSURANCE

## 2012-02-17 ENCOUNTER — Encounter (HOSPITAL_COMMUNITY)
Admission: RE | Admit: 2012-02-17 | Discharge: 2012-02-17 | Disposition: A | Discharge status: Home / Self Care | Payer: PRIVATE HEALTH INSURANCE | Source: Ambulatory Visit | Attending: Cardiology | Admitting: Cardiology

## 2012-02-17 ENCOUNTER — Encounter (HOSPITAL_COMMUNITY): Payer: PRIVATE HEALTH INSURANCE

## 2012-02-17 NOTE — Progress Notes (Signed)
Eddie Lewis told me this afternoon that he does not have health insurance with his new job.  Eddie Lewis did not exercise today.  Dr Annitta Jersey office called and notified that Eddie Lewis is not going to continue exercise at cardiac rehab due to finances.  Eddie Lewis left cardiac rehab to pick up medication samples. We will discharge Eddie Lewis from cardiac rehab at this time.

## 2012-02-19 ENCOUNTER — Encounter (HOSPITAL_COMMUNITY): Payer: PRIVATE HEALTH INSURANCE

## 2012-02-21 ENCOUNTER — Encounter (HOSPITAL_COMMUNITY): Payer: PRIVATE HEALTH INSURANCE

## 2012-02-24 ENCOUNTER — Encounter (HOSPITAL_COMMUNITY): Payer: PRIVATE HEALTH INSURANCE

## 2012-02-26 ENCOUNTER — Encounter (HOSPITAL_COMMUNITY): Payer: PRIVATE HEALTH INSURANCE

## 2012-02-28 ENCOUNTER — Encounter (HOSPITAL_COMMUNITY): Payer: PRIVATE HEALTH INSURANCE

## 2012-03-02 ENCOUNTER — Encounter (HOSPITAL_COMMUNITY): Payer: PRIVATE HEALTH INSURANCE

## 2012-03-04 ENCOUNTER — Encounter (HOSPITAL_COMMUNITY): Payer: PRIVATE HEALTH INSURANCE

## 2012-03-06 ENCOUNTER — Encounter (HOSPITAL_COMMUNITY): Payer: PRIVATE HEALTH INSURANCE

## 2012-03-09 ENCOUNTER — Encounter (HOSPITAL_COMMUNITY): Payer: PRIVATE HEALTH INSURANCE

## 2012-03-11 ENCOUNTER — Encounter (HOSPITAL_COMMUNITY): Payer: PRIVATE HEALTH INSURANCE

## 2012-03-13 ENCOUNTER — Encounter (HOSPITAL_COMMUNITY): Payer: PRIVATE HEALTH INSURANCE

## 2012-03-16 ENCOUNTER — Encounter (HOSPITAL_COMMUNITY): Payer: PRIVATE HEALTH INSURANCE

## 2012-03-18 ENCOUNTER — Encounter (HOSPITAL_COMMUNITY): Payer: PRIVATE HEALTH INSURANCE

## 2012-03-20 ENCOUNTER — Encounter (HOSPITAL_COMMUNITY): Payer: PRIVATE HEALTH INSURANCE

## 2012-03-23 ENCOUNTER — Encounter (HOSPITAL_COMMUNITY): Payer: PRIVATE HEALTH INSURANCE

## 2012-03-27 ENCOUNTER — Encounter (HOSPITAL_COMMUNITY): Payer: PRIVATE HEALTH INSURANCE

## 2012-03-30 ENCOUNTER — Encounter (HOSPITAL_COMMUNITY): Payer: PRIVATE HEALTH INSURANCE

## 2012-04-01 ENCOUNTER — Encounter (HOSPITAL_COMMUNITY): Payer: PRIVATE HEALTH INSURANCE

## 2012-04-03 ENCOUNTER — Encounter (HOSPITAL_COMMUNITY): Payer: PRIVATE HEALTH INSURANCE

## 2012-04-06 ENCOUNTER — Encounter (HOSPITAL_COMMUNITY): Payer: PRIVATE HEALTH INSURANCE

## 2012-04-08 ENCOUNTER — Encounter (HOSPITAL_COMMUNITY): Payer: PRIVATE HEALTH INSURANCE

## 2012-04-10 ENCOUNTER — Encounter (HOSPITAL_COMMUNITY): Payer: PRIVATE HEALTH INSURANCE

## 2012-04-13 ENCOUNTER — Encounter (HOSPITAL_COMMUNITY): Payer: PRIVATE HEALTH INSURANCE

## 2012-04-15 ENCOUNTER — Encounter (HOSPITAL_COMMUNITY): Payer: PRIVATE HEALTH INSURANCE

## 2012-04-17 ENCOUNTER — Encounter (HOSPITAL_COMMUNITY): Payer: PRIVATE HEALTH INSURANCE

## 2012-04-20 ENCOUNTER — Encounter (HOSPITAL_COMMUNITY): Payer: PRIVATE HEALTH INSURANCE

## 2012-04-22 ENCOUNTER — Encounter (HOSPITAL_COMMUNITY): Payer: PRIVATE HEALTH INSURANCE

## 2012-04-24 ENCOUNTER — Encounter (HOSPITAL_COMMUNITY): Payer: PRIVATE HEALTH INSURANCE

## 2013-05-16 ENCOUNTER — Encounter (HOSPITAL_COMMUNITY): Payer: Self-pay | Admitting: Emergency Medicine

## 2013-05-16 ENCOUNTER — Emergency Department (HOSPITAL_COMMUNITY)
Admission: EM | Admit: 2013-05-16 | Discharge: 2013-05-16 | Disposition: A | Discharge status: Home / Self Care | Payer: Worker's Compensation | Attending: Emergency Medicine | Admitting: Emergency Medicine

## 2013-05-16 ENCOUNTER — Emergency Department (HOSPITAL_COMMUNITY): Payer: Worker's Compensation

## 2013-05-16 DIAGNOSIS — Z87891 Personal history of nicotine dependence: Secondary | ICD-10-CM | POA: Insufficient documentation

## 2013-05-16 DIAGNOSIS — S9030XA Contusion of unspecified foot, initial encounter: Secondary | ICD-10-CM | POA: Insufficient documentation

## 2013-05-16 DIAGNOSIS — Z9889 Other specified postprocedural states: Secondary | ICD-10-CM | POA: Insufficient documentation

## 2013-05-16 DIAGNOSIS — Z7982 Long term (current) use of aspirin: Secondary | ICD-10-CM | POA: Insufficient documentation

## 2013-05-16 DIAGNOSIS — Z79899 Other long term (current) drug therapy: Secondary | ICD-10-CM | POA: Insufficient documentation

## 2013-05-16 DIAGNOSIS — Y9389 Activity, other specified: Secondary | ICD-10-CM | POA: Insufficient documentation

## 2013-05-16 DIAGNOSIS — Y929 Unspecified place or not applicable: Secondary | ICD-10-CM | POA: Insufficient documentation

## 2013-05-16 DIAGNOSIS — I252 Old myocardial infarction: Secondary | ICD-10-CM | POA: Insufficient documentation

## 2013-05-16 DIAGNOSIS — Z9861 Coronary angioplasty status: Secondary | ICD-10-CM | POA: Insufficient documentation

## 2013-05-16 DIAGNOSIS — W208XXA Other cause of strike by thrown, projected or falling object, initial encounter: Secondary | ICD-10-CM | POA: Insufficient documentation

## 2013-05-16 DIAGNOSIS — Z87442 Personal history of urinary calculi: Secondary | ICD-10-CM | POA: Insufficient documentation

## 2013-05-16 HISTORY — DX: Acute myocardial infarction, unspecified: I21.9

## 2013-05-16 MED ORDER — HYDROCODONE-ACETAMINOPHEN 5-325 MG PO TABS: 1.0000 | ORAL_TABLET | Freq: Four times a day (QID) | ORAL | Status: DC | PRN

## 2013-05-16 MED ORDER — OXYCODONE-ACETAMINOPHEN 5-325 MG PO TABS
1.0000 | ORAL_TABLET | Freq: Once | ORAL | Status: AC
  Administered 2013-05-16: 1 via ORAL
  Filled 2013-05-16: qty 1

## 2013-05-16 MED ORDER — NAPROXEN 500 MG PO TABS: 500.0000 mg | ORAL_TABLET | Freq: Two times a day (BID) | ORAL | Status: DC

## 2013-05-16 NOTE — ED Notes (Signed)
Pt demonstrated crutches well.

## 2013-05-16 NOTE — ED Notes (Signed)
Pt had heavy heater fall onto right distal foot. Pt c/o pain to medial area just under great toe pedal pulses present. Redness noted to right anterior/medial foot with slight swelling to toes.

## 2013-05-16 NOTE — ED Provider Notes (Signed)
CSN: [id number]     Arrival date & time 05/16/13  [id number] History   This chart was scribed for Eddie LennertJoseph L Leyah Bocchino, MD by Manuela Schwartzaylor Day, ED scribe. This patient was seen in room APA11/APA11 and the patient's care was started at [phone].  Chief Complaint  Patient presents with  . Foot Pain   Patient is a 53 y.o. male presenting with foot injury. The history is provided by the patient. No language interpreter was used.  Foot Injury Location:  Foot Time since incident:  1 day Injury: yes   Foot location:  R foot Pain details:    Quality:  Aching   Radiates to:  Does not radiate   Severity:  Moderate   Onset quality:  Sudden   Duration:  1 day   Timing:  Constant   Progression:  Unchanged Chronicity:  New Foreign body present:  No foreign bodies Relieved by:  Nothing Worsened by:  Bearing weight Associated symptoms: no back pain and no fever    HPI Comments: Ruthine DoseDavid L Bobak is a 53 y.o. male who presents to the Emergency Department complaining of sudden onset, constant, moderate to severe, right foot pain w/associated swelling after he dropped a heavy heater that he was carrying up the stairs onto the dorsal aspect of his right foot; this occurred yesterday. He reports painful ambulation since time of injury. He denies any other injuries. He states swelling of his foot gradually worsened after time of injury.   Past Medical History  Diagnosis Date  . Kidney stones 2006  . Myocardial infarction    Past Surgical History  Procedure Laterality Date  . No past surgeries    . Cardiac catheterization    . Coronary stent placement  11/28/11    2nd stent on 12/03/11   Family History  Problem Relation Age of Onset  . Other Father    History  Substance Use Topics  . Smoking status: Former Smoker -- 1.00 packs/day for 33 years    Quit date: 11/27/2011  . Smokeless tobacco: Never Used  . Alcohol Use: No    Review of Systems  Constitutional: Negative for fever and chills.  Respiratory: Negative  for cough and shortness of breath.   Cardiovascular: Negative for chest pain.  Gastrointestinal: Negative for abdominal pain.  Musculoskeletal: Negative for back pain.       Right foot pain  All other systems reviewed and are negative.   Allergies  Codeine  Home Medications   Current Outpatient Rx  Name  Route  Sig  Dispense  Refill  . aspirin EC 81 MG tablet   Oral   Take 81 mg by mouth daily.         Marland Kitchen. atorvastatin (LIPITOR) 80 MG tablet   Oral   Take 80 mg by mouth daily with breakfast.         . metoprolol tartrate (LOPRESSOR) 25 MG tablet   Oral   Take 12.5 mg by mouth 2 (two) times daily.         . ramipril (ALTACE) 2.5 MG capsule   Oral   Take 2.5 mg by mouth daily.         Marland Kitchen. EXPIRED: nitroGLYCERIN (NITROSTAT) 0.4 MG SL tablet   Sublingual   Place 1 tablet (0.4 mg total) under the tongue every 5 (five) minutes x 3 doses as needed for chest pain.   25 tablet   3    Triage Vitals: BP 123/95  Pulse 94  Temp(Src) 98.2  F (36.8 C) (Oral)  Resp 18  SpO2 97%  Physical Exam  Nursing note and vitals reviewed. Constitutional: He is oriented to person, place, and time. He appears well-developed.  HENT:  Head: Normocephalic.  Eyes: Conjunctivae are normal.  Neck: No tracheal deviation present.  Cardiovascular:  No murmur heard. Musculoskeletal: Normal range of motion. He exhibits tenderness.  Tenderness at first MCP joint, right foot  Neurological: He is oriented to person, place, and time.  Skin: Skin is warm.  Psychiatric: He has a normal mood and affect.    ED Course  Procedures (including critical care time) DIAGNOSTIC STUDIES: Oxygen Saturation is 97% on room air, normal by my interpretation.    COORDINATION OF CARE: At 1000 AM Discussed treatment plan with patient which includes right foot X-ray. Patient agrees.   Labs Review Labs Reviewed - No data to display Imaging Review No results found.  EKG Interpretation   None      MDM    Final diagnoses:  None   I personally performed the services described in this documentation, which was scribed in my presence. The recorded information has been reviewed and is accurate.     The chart was scribed for me under my direct supervision.  I personally performed the history, physical, and medical decision making and all procedures in the evaluation of this patient.Eddie Lennert, MD 05/16/13 1058

## 2013-05-16 NOTE — Discharge Instructions (Signed)
Follow up with Dr. Hilda LiasKeeling or Fuller CanadaStanley Harrison in one week.  Keep foot elevated

## 2013-07-21 IMAGING — CR DG CHEST 1V PORT
1 series · 1 of 1 positions shown · non-contrast
Comparison: None.

CLINICAL DATA: STEMI

PORTABLE CHEST - 1 VIEW

[AP]
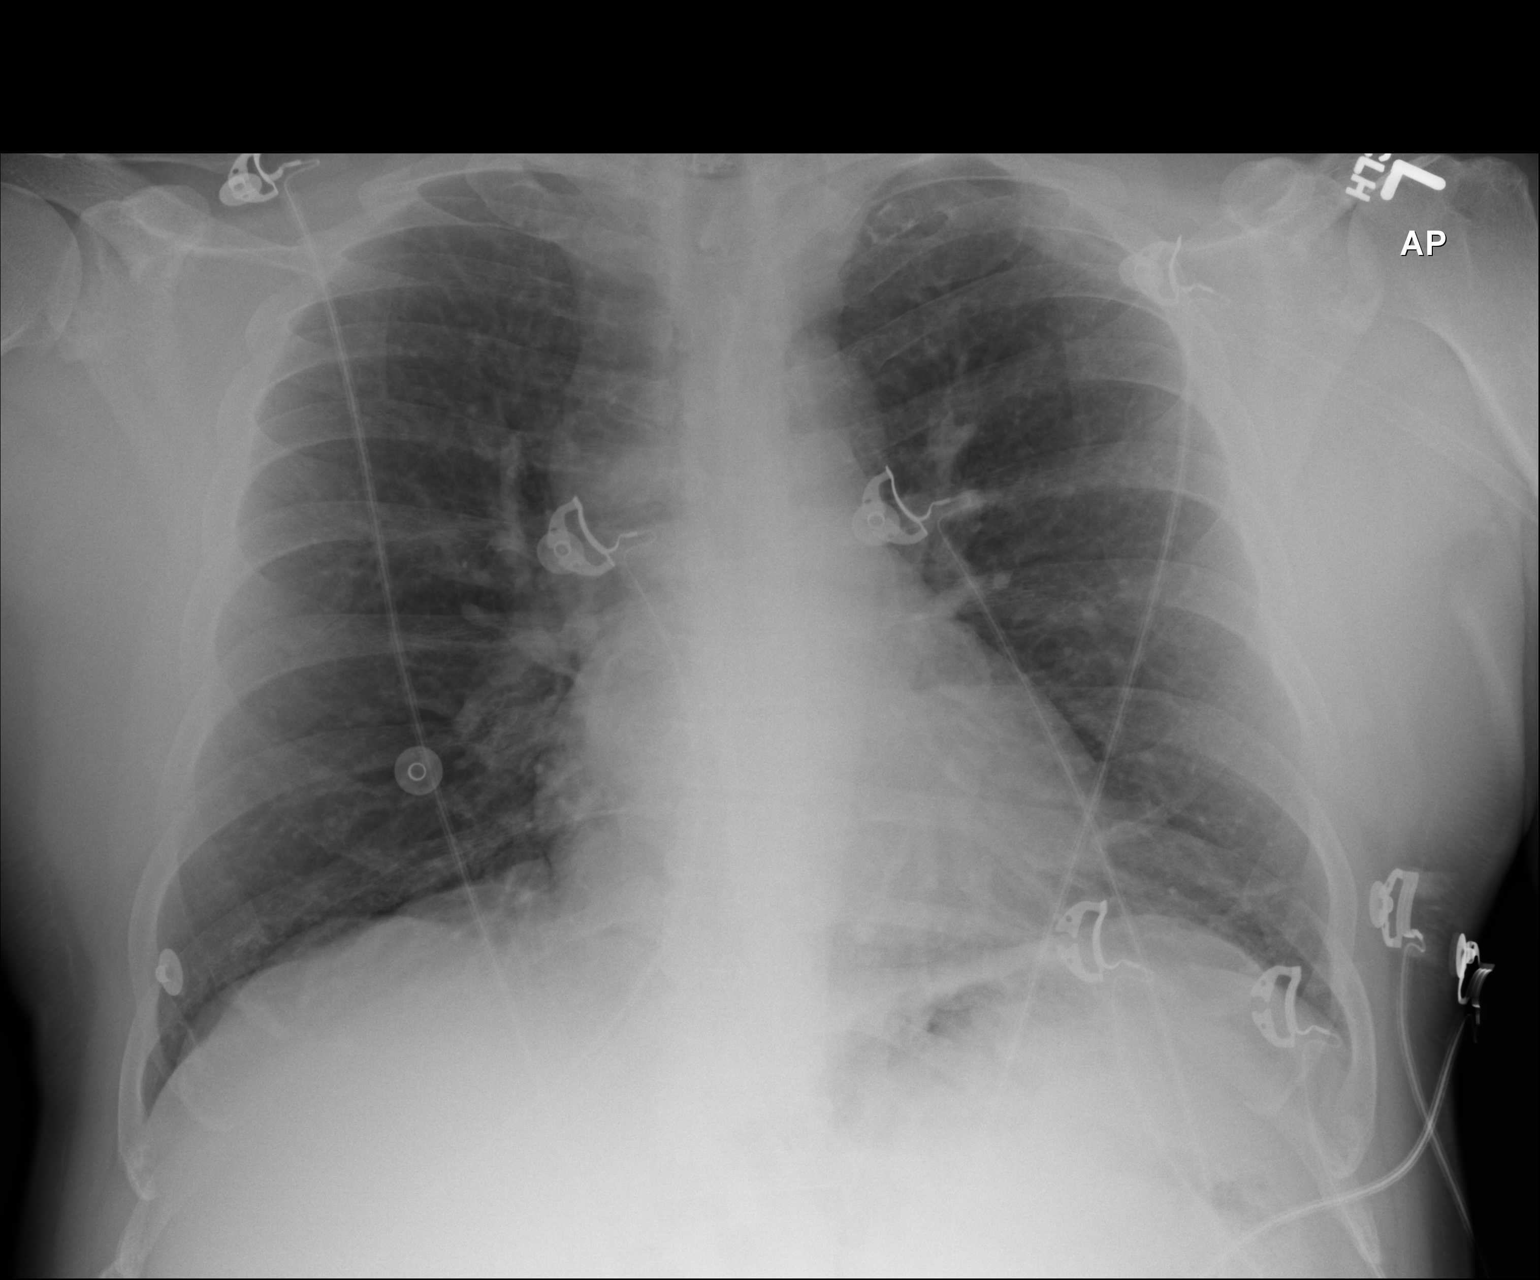

[1 of 1 positions shown; findings below may reference images not displayed]

FINDINGS: Heart size within normal range. Mild right
paratracheal/suprahilar fullness is likely vascular.  Mild left
greater right lung base opacities.  Mild peribronchial thickening
and reticulonodular interstitial prominence.  No pleural effusion
or pneumothorax.  No acute osseous finding.
IMPRESSION: Mild bibasilar opacities; likely atelectasis.

Peribronchial thickening and interstitial prominence is
nonspecific.  May reflect acute or chronic bronchitic change.

Right paratracheal/suprahilar fullness.  Recommend attention with
PA and lateral radiograph follow-up.

## 2014-03-04 ENCOUNTER — Encounter (HOSPITAL_COMMUNITY): Payer: Self-pay | Admitting: Adult Health

## 2014-03-04 ENCOUNTER — Inpatient Hospital Stay (HOSPITAL_COMMUNITY)
Admission: EM | Admit: 2014-03-04 | Discharge: 2014-03-06 | DRG: 303 | Disposition: A | Discharge status: Home / Self Care | Payer: BC Managed Care – PPO | Attending: Cardiology | Admitting: Cardiology

## 2014-03-04 ENCOUNTER — Emergency Department (HOSPITAL_COMMUNITY): Payer: BC Managed Care – PPO

## 2014-03-04 DIAGNOSIS — Z7982 Long term (current) use of aspirin: Secondary | ICD-10-CM | POA: Diagnosis not present

## 2014-03-04 DIAGNOSIS — F17210 Nicotine dependence, cigarettes, uncomplicated: Secondary | ICD-10-CM | POA: Diagnosis present

## 2014-03-04 DIAGNOSIS — E780 Pure hypercholesterolemia: Secondary | ICD-10-CM | POA: Diagnosis present

## 2014-03-04 DIAGNOSIS — Z885 Allergy status to narcotic agent status: Secondary | ICD-10-CM | POA: Diagnosis not present

## 2014-03-04 DIAGNOSIS — I10 Essential (primary) hypertension: Secondary | ICD-10-CM | POA: Diagnosis present

## 2014-03-04 DIAGNOSIS — I25110 Atherosclerotic heart disease of native coronary artery with unstable angina pectoris: Principal | ICD-10-CM | POA: Diagnosis present

## 2014-03-04 DIAGNOSIS — I252 Old myocardial infarction: Secondary | ICD-10-CM | POA: Diagnosis not present

## 2014-03-04 DIAGNOSIS — Z6833 Body mass index (BMI) 33.0-33.9, adult: Secondary | ICD-10-CM | POA: Diagnosis not present

## 2014-03-04 DIAGNOSIS — I200 Unstable angina: Secondary | ICD-10-CM | POA: Diagnosis present

## 2014-03-04 DIAGNOSIS — R079 Chest pain, unspecified: Secondary | ICD-10-CM | POA: Diagnosis present

## 2014-03-04 DIAGNOSIS — E7439 Other disorders of intestinal carbohydrate absorption: Secondary | ICD-10-CM | POA: Diagnosis present

## 2014-03-04 DIAGNOSIS — Z955 Presence of coronary angioplasty implant and graft: Secondary | ICD-10-CM

## 2014-03-04 DIAGNOSIS — Z87442 Personal history of urinary calculi: Secondary | ICD-10-CM

## 2014-03-04 HISTORY — DX: Chest pain, unspecified: R07.9

## 2014-03-04 LAB — COMPREHENSIVE METABOLIC PANEL
ALT: 21 U/L (ref 0–53)
AST: 20 U/L (ref 0–37)
Albumin: 3.8 g/dL (ref 3.5–5.2)
Alkaline Phosphatase: 63 U/L (ref 39–117)
Anion gap: 9 (ref 5–15)
BILIRUBIN TOTAL: 0.4 mg/dL (ref 0.3–1.2)
BUN: 15 mg/dL (ref 6–23)
CHLORIDE: 100 meq/L (ref 96–112)
CO2: 30 meq/L (ref 19–32)
CREATININE: 0.84 mg/dL (ref 0.50–1.35)
Calcium: 9.2 mg/dL (ref 8.4–10.5)
GFR calc Af Amer: >90 mL/min (ref >90)
Glucose, Bld: 134 mg/dL — ABNORMAL HIGH (ref 70–99)
Potassium: 4.7 mEq/L (ref 3.7–5.3)
Sodium: 139 mEq/L (ref 137–147)
Total Protein: 6.8 g/dL (ref 6.0–8.3)

## 2014-03-04 LAB — CBC
HEMATOCRIT: 45.2 % (ref 39.0–52.0)
HEMOGLOBIN: 15 g/dL (ref 13.0–17.0)
MCH: 29.4 pg (ref 26.0–34.0)
MCHC: 33.2 g/dL (ref 30.0–36.0)
MCV: 88.6 fL (ref 78.0–100.0)
Platelets: 206 10*3/uL (ref 150–400)
RBC: 5.1 MIL/uL (ref 4.22–5.81)
RDW: 12.2 % (ref 11.5–15.5)
WBC: 9.8 10*3/uL (ref 4.0–10.5)

## 2014-03-04 LAB — HEPARIN LEVEL (UNFRACTIONATED)
HEPARIN UNFRACTIONATED: 0.45 [IU]/mL (ref 0.30–0.70)
Heparin Unfractionated: 0.19 IU/mL — ABNORMAL LOW (ref 0.30–0.70)

## 2014-03-04 LAB — BASIC METABOLIC PANEL
Anion gap: 14 (ref 5–15)
BUN: 16 mg/dL (ref 6–23)
CHLORIDE: 99 meq/L (ref 96–112)
CO2: 27 meq/L (ref 19–32)
Calcium: 9.2 mg/dL (ref 8.4–10.5)
Creatinine, Ser: 0.8 mg/dL (ref 0.50–1.35)
GFR calc non Af Amer: >90 mL/min (ref >90)
Glucose, Bld: 106 mg/dL — ABNORMAL HIGH (ref 70–99)
POTASSIUM: 4.4 meq/L (ref 3.7–5.3)
Sodium: 140 mEq/L (ref 137–147)

## 2014-03-04 LAB — TROPONIN I
Troponin I: <0.3 ng/mL (ref <0.30)
Troponin I: <0.3 ng/mL (ref <0.30)

## 2014-03-04 LAB — HEMOGLOBIN A1C
HEMOGLOBIN A1C: 6.4 % — AB (ref <5.7)
MEAN PLASMA GLUCOSE: 137 mg/dL — AB (ref <117)

## 2014-03-04 LAB — POCT I-STAT TROPONIN I: Troponin i, poc: 0 ng/mL (ref 0.00–0.08)

## 2014-03-04 LAB — TSH: TSH: 2.78 u[IU]/mL (ref 0.350–4.500)

## 2014-03-04 MED ORDER — ASPIRIN EC 81 MG PO TBEC
81.0000 mg | DELAYED_RELEASE_TABLET | Freq: Every day | ORAL | Status: DC
  Administered 2014-03-05: 81 mg via ORAL
  Filled 2014-03-04 (×2): qty 1

## 2014-03-04 MED ORDER — NITROGLYCERIN 0.4 MG SL SUBL
0.4000 mg | SUBLINGUAL_TABLET | SUBLINGUAL | Status: DC | PRN
Start: 2014-03-04 — End: 2014-03-06

## 2014-03-04 MED ORDER — ONDANSETRON HCL 4 MG/2ML IJ SOLN: 4.0000 mg | Freq: Four times a day (QID) | INTRAMUSCULAR | Status: DC | PRN

## 2014-03-04 MED ORDER — NITROGLYCERIN 2 % TD OINT
0.5000 [in_us] | TOPICAL_OINTMENT | Freq: Four times a day (QID) | TRANSDERMAL | Status: DC
  Administered 2014-03-04: 0.5 [in_us] via TOPICAL

## 2014-03-04 MED ORDER — SODIUM CHLORIDE 0.9 % IV SOLN
INTRAVENOUS | Status: DC
  Administered 2014-03-04: 10:00:00 via INTRAVENOUS

## 2014-03-04 MED ORDER — NITROGLYCERIN IN D5W 200-5 MCG/ML-% IV SOLN
2.0000 ug/min | INTRAVENOUS | Status: DC
  Administered 2014-03-04: 5 ug/min via INTRAVENOUS

## 2014-03-04 MED ORDER — NITROGLYCERIN 0.4 MG SL SUBL: 0.4000 mg | SUBLINGUAL_TABLET | SUBLINGUAL | Status: DC | PRN

## 2014-03-04 MED ORDER — HEPARIN BOLUS VIA INFUSION
3000.0000 [IU] | Freq: Once | INTRAVENOUS | Status: AC
  Administered 2014-03-04: 3000 [IU] via INTRAVENOUS
  Filled 2014-03-04: qty 3000

## 2014-03-04 MED ORDER — METOPROLOL TARTRATE 12.5 MG HALF TABLET
12.5000 mg | ORAL_TABLET | Freq: Two times a day (BID) | ORAL | Status: DC
  Administered 2014-03-04 – 2014-03-05 (×4): 12.5 mg via ORAL
  Filled 2014-03-04 (×6): qty 1

## 2014-03-04 MED ORDER — ASPIRIN 325 MG PO TABS
325.0000 mg | ORAL_TABLET | ORAL | Status: AC
  Administered 2014-03-04: 325 mg via ORAL

## 2014-03-04 MED ORDER — PANTOPRAZOLE SODIUM 40 MG PO TBEC
40.0000 mg | DELAYED_RELEASE_TABLET | Freq: Every day | ORAL | Status: DC
  Administered 2014-03-04 – 2014-03-06 (×3): 40 mg via ORAL
  Filled 2014-03-04: qty 1

## 2014-03-04 MED ORDER — ACETAMINOPHEN 325 MG PO TABS
650.0000 mg | ORAL_TABLET | ORAL | Status: DC | PRN
  Administered 2014-03-05: 650 mg via ORAL

## 2014-03-04 MED ORDER — ASPIRIN EC 81 MG PO TBEC: 81.0000 mg | DELAYED_RELEASE_TABLET | Freq: Every day | ORAL | Status: DC

## 2014-03-04 MED ORDER — HEPARIN (PORCINE) IN NACL 100-0.45 UNIT/ML-% IJ SOLN
1450.0000 [IU]/h | INTRAMUSCULAR | Status: DC
  Administered 2014-03-04: 1250 [IU]/h via INTRAVENOUS
  Administered 2014-03-04: 1000 [IU]/h via INTRAVENOUS
  Filled 2014-03-04 (×6): qty 250

## 2014-03-04 MED ORDER — HEPARIN BOLUS VIA INFUSION
4000.0000 [IU] | Freq: Once | INTRAVENOUS | Status: AC
  Administered 2014-03-04: 4000 [IU] via INTRAVENOUS
  Filled 2014-03-04: qty 4000

## 2014-03-04 MED ORDER — NITROGLYCERIN IN D5W 200-5 MCG/ML-% IV SOLN
5.0000 ug/min | INTRAVENOUS | Status: DC
Start: 2014-03-04 — End: 2014-03-04

## 2014-03-04 MED ORDER — ATORVASTATIN CALCIUM 80 MG PO TABS
80.0000 mg | ORAL_TABLET | Freq: Every day | ORAL | Status: DC
  Administered 2014-03-04 – 2014-03-05 (×2): 80 mg via ORAL
  Filled 2014-03-04 (×3): qty 1

## 2014-03-04 MED ORDER — ASPIRIN 300 MG RE SUPP
300.0000 mg | RECTAL | Status: AC
  Filled 2014-03-04: qty 1

## 2014-03-04 MED ORDER — ASPIRIN 81 MG PO CHEW: 324.0000 mg | CHEWABLE_TABLET | ORAL | Status: AC

## 2014-03-04 NOTE — H&P (Signed)
Eddie Lewis is an 53 y.o. male.   Chief Complaint: Retrosternal and left-sided chest pressure HPI: Patient is 53 year old male with past medical history significant for coronary artery disease history of anterolateral wall myocardial infarction in August 2013 percent. PCI to 100% occluded LAD and followed by PCI to distal RCA, hypertension, hypercholesteremia, glucose intolerance, morbid obesity, tobacco abuse, came to the ER complaining of retrosternal and left-sided chest pain radiating to left shoulder grade 4 over [place] woke him up from sleep patient took one sublingual nitroglycerin with partial relief so decided to come to ED. Patient denies any nausea vomiting diaphoresis denies palpitation lightheadedness or syncope. States he gets exertional chest pain off and on lasting few minutes relieved with rest off and on. Her patient was started on IV heparin and nitrates in the ED with relief of chest pain. Patient presently denies any chest pain. His first set of cardiac enzyme was negative which is not present in epic EKG showed no acute ischemic changes as per ED physician although EKG is not available in epic.  Past Medical History  Diagnosis Date  . Kidney stones 2006  . Myocardial infarction     Past Surgical History  Procedure Laterality Date  . No past surgeries    . Cardiac catheterization    . Coronary stent placement  11/28/11    2nd stent on 12/03/11    Family History  Problem Relation Age of Onset  . Other Father    Social History:  reports that he quit smoking about 2 years ago. He has never used smokeless tobacco. He reports that he does not drink alcohol or use illicit drugs.  Allergies:  Allergies  Allergen Reactions  . Codeine Nausea And Vomiting    Medications Prior to Admission  Medication Sig Dispense Refill  . aspirin EC 81 MG tablet Take 81 mg by mouth daily.    Marland Kitchen atorvastatin (LIPITOR) 80 MG tablet Take 80 mg by mouth daily with breakfast.    . metoprolol  succinate (TOPROL-XL) 25 MG 24 hr tablet Take 25 mg by mouth daily.    . nitroGLYCERIN (NITROSTAT) 0.4 MG SL tablet Place 1 tablet (0.4 mg total) under the tongue every 5 (five) minutes x 3 doses as needed for chest pain. 25 tablet 3  . ramipril (ALTACE) 2.5 MG capsule Take 2.5 mg by mouth daily.    Marland Kitchen HYDROcodone-acetaminophen (NORCO/VICODIN) 5-325 MG per tablet Take 1 tablet by mouth every 6 (six) hours as needed for moderate pain. (Patient not taking: Reported on 03/04/2014) 30 tablet 0  . metoprolol tartrate (LOPRESSOR) 25 MG tablet Take 12.5 mg by mouth 2 (two) times daily.    . naproxen (NAPROSYN) 500 MG tablet Take 1 tablet (500 mg total) by mouth 2 (two) times daily. (Patient not taking: Reported on 03/04/2014) 20 tablet 0    Results for orders placed or performed during the hospital encounter of 03/04/14 (from the past 48 hour(s))  CBC     Status: None   Collection Time: 03/04/14  1:40 AM  Result Value Ref Range   WBC 9.8 4.0 - 10.5 K/uL   RBC 5.10 4.22 - 5.81 MIL/uL   Hemoglobin 15.0 13.0 - 17.0 g/dL   HCT 45.2 39.0 - 52.0 %   MCV 88.6 78.0 - 100.0 fL   MCH 29.4 26.0 - 34.0 pg   MCHC 33.2 30.0 - 36.0 g/dL   RDW 12.2 11.5 - 15.5 %   Platelets 206 150 - 400 K/uL  Basic  metabolic panel     Status: Abnormal   Collection Time: 03/04/14  1:40 AM  Result Value Ref Range   Sodium 140 137 - 147 mEq/L   Potassium 4.4 3.7 - 5.3 mEq/L   Chloride 99 96 - 112 mEq/L   CO2 27 19 - 32 mEq/L   Glucose, Bld 106 (H) 70 - 99 mg/dL   BUN 16 6 - 23 mg/dL   Creatinine, Ser 0.80 0.50 - 1.35 mg/dL   Calcium 9.2 8.4 - 10.5 mg/dL   GFR calc non Af Amer >90 >90 mL/min   GFR calc Af Amer >90 >90 mL/min    Comment: (NOTE) The eGFR has been calculated using the CKD EPI equation. This calculation has not been validated in all clinical situations. eGFR's persistently <90 mL/min signify possible Chronic Kidney Disease.    Anion gap 14 5 - 15   Dg Chest Port 1 View  03/04/2014   CLINICAL DATA:  Acute  chest pain.  Initial encounter.  EXAM: PORTABLE CHEST - 1 VIEW  COMPARISON:  11/29/2011 chest radiographs  FINDINGS: The cardiomediastinal silhouette is unremarkable.  There is no evidence of focal airspace disease, pulmonary edema, suspicious pulmonary nodule/mass, pleural effusion, or pneumothorax. No acute bony abnormalities are identified.  IMPRESSION: No active disease.   Electronically Signed   By: Hassan Rowan M.D.   On: 03/04/2014 02:12    Review of Systems  Constitutional: Negative for fever and chills.  HENT: Negative for hearing loss.   Eyes: Negative for blurred vision and double vision.  Respiratory: Negative for cough, hemoptysis and sputum production.   Cardiovascular: Positive for chest pain. Negative for palpitations, orthopnea and claudication.  Gastrointestinal: Negative for nausea, vomiting and abdominal pain.  Genitourinary: Negative for dysuria and urgency.  Neurological: Negative for dizziness, tingling and headaches.    Blood pressure 94/67, pulse 76, temperature 97.8 F (36.6 C), temperature source Oral, resp. rate 17, height 5' 4"  (1.626 m), weight 87.5 kg (192 lb 14.4 oz), SpO2 96 %. Physical Exam  Constitutional: He is oriented to person, place, and time.  HENT:  Head: Normocephalic and atraumatic.  Eyes: Conjunctivae are normal. Left eye exhibits no discharge. No scleral icterus.  Neck: Normal range of motion. Neck supple. No JVD present. No tracheal deviation present. No thyromegaly present.  Cardiovascular: Normal rate and regular rhythm.   Murmur (Soft systolic murmur noted) heard. Respiratory: Effort normal and breath sounds normal. No respiratory distress. He has no wheezes.  GI: Soft. Bowel sounds are normal. He exhibits no distension. There is no tenderness.  Musculoskeletal: He exhibits no edema or tenderness.  Neurological: He is alert and oriented to person, place, and time.     Assessment/Plan Unstable angina rule out MI Coronary artery disease  history of anterolateral wall MI in the past status post PCI to LAD and RCA in the past Hypertension Hypercholesteremia Glucose intolerance Morbid obesity Tobacco abuse Plan As per orders  Vincenzina Jagoda N 03/04/2014, 7:34 AM

## 2014-03-04 NOTE — Progress Notes (Signed)
ANTICOAGULATION CONSULT NOTE - Initial Consult  Pharmacy Consult for heparin Indication: USAP  Allergies  Allergen Reactions  . Codeine Nausea And Vomiting    Patient Measurements: Heparin Dosing Weight: 85kg  Vital Signs: Temp: 98.5 F (36.9 C) (12/04 0135) Temp Source: Oral (12/04 0135) BP: 113/69 mmHg (12/04 0300) Pulse Rate: 85 (12/04 0300)  Labs:  Recent Labs  03/04/14 0140  HGB 15.0  HCT 45.2  PLT 206  CREATININE 0.80     Medical History: Past Medical History  Diagnosis Date  . Kidney stones 2006  . Myocardial infarction      Assessment: 53yo male c/o indigestion that turned into chest tightness associated w/ SOB, to begin heparin for USAP.  Goal of Therapy:  Heparin level 0.3-0.7 units/ml Monitor platelets by anticoagulation protocol: Yes   Plan:  Will give heparin 4000 units IV x1 followed by gtt at 1000 units/hr and monitor heparin levels and CBC.  Vernard GamblesVeronda Liahm Grivas, PharmD, BCPS  03/04/2014,4:35 AM

## 2014-03-04 NOTE — Plan of Care (Signed)
Problem: Phase I Progression Outcomes Goal: Hemodynamically stable Outcome: Completed/Met Date Met:  03/04/14 Goal: Aspirin unless contraindicated Outcome: Completed/Met Date Met:  03/04/14 Goal: MD aware of Cardiac Marker results Outcome: Completed/Met Date Met:  03/04/14 Goal: Voiding-avoid urinary catheter unless indicated Outcome: Completed/Met Date Met:  03/04/14

## 2014-03-04 NOTE — ED Provider Notes (Signed)
CSN: [id number]     Arrival date & time 03/04/14  0126 History  This chart was scribed for Olivia Mackielga M Samai Corea, MD by Murriel HopperAlec Bankhead, ED Scribe. This patient was seen in room A13C/A13C and the patient's care was started at 2:31 AM.    Chief Complaint  Patient presents with  . Chest Pain    The history is provided by the patient. No language interpreter was used.     HPI Comments: Eddie Lewis is a 53 y.o. male with a Hx of MI who presents to the Emergency Department complaining of left-sided intermittent chest pain and tightness with associated leg swelling that began 4 hours PTA. Pt states that he began to have indigestion, which turned into central and left-sided chest pain that radiates to the shoulder. Pt states that earlier he could feel a heartbeat sensation in his shoulder, elbow, and fingers. Pt states that his pain has gotten better since earlier, as he took NTG at the onset of his indigestion, as indigestion was the start of an MI that occurred two years ago. Pt states that his pain has become better, but he is still currently having some discomfort. Pt states that he currently feels pressure in his chest. Pt states that the pain he was having earlier felt the same as it did whenever he had an MI two years ago. Pt is a smoker and states that he smokes a pack a day. Pt denies SOB, nausea, diaphoresis     Past Medical History  Diagnosis Date  . Kidney stones 2006  . Myocardial infarction    Past Surgical History  Procedure Laterality Date  . No past surgeries    . Cardiac catheterization    . Coronary stent placement  11/28/11    2nd stent on 12/03/11   Family History  Problem Relation Age of Onset  . Other Father    History  Substance Use Topics  . Smoking status: Former Smoker -- 1.00 packs/day for 33 years    Quit date: 11/27/2011  . Smokeless tobacco: Never Used  . Alcohol Use: No    Review of Systems  Constitutional: Negative for diaphoresis.  Respiratory: Positive for  chest tightness. Negative for shortness of breath.   Cardiovascular: Positive for chest pain and leg swelling.  Gastrointestinal: Negative for nausea.  All other systems reviewed and are negative.     Allergies  Codeine  Home Medications   Prior to Admission medications   Medication Sig Start Date End Date Taking? Authorizing Provider  aspirin EC 81 MG tablet Take 81 mg by mouth daily.   Yes Historical Provider, MD  atorvastatin (LIPITOR) 80 MG tablet Take 80 mg by mouth daily with breakfast. 12/04/11 03/04/14 Yes Robynn PaneMohan N Harwani, MD  metoprolol succinate (TOPROL-XL) 25 MG 24 hr tablet Take 25 mg by mouth daily.   Yes Historical Provider, MD  nitroGLYCERIN (NITROSTAT) 0.4 MG SL tablet Place 1 tablet (0.4 mg total) under the tongue every 5 (five) minutes x 3 doses as needed for chest pain. 12/04/11 03/04/14 Yes Robynn PaneMohan N Harwani, MD  ramipril (ALTACE) 2.5 MG capsule Take 2.5 mg by mouth daily.   Yes Historical Provider, MD  HYDROcodone-acetaminophen (NORCO/VICODIN) 5-325 MG per tablet Take 1 tablet by mouth every 6 (six) hours as needed for moderate pain. Patient not taking: Reported on 03/04/2014 05/16/13   Benny LennertJoseph L Zammit, MD  metoprolol tartrate (LOPRESSOR) 25 MG tablet Take 12.5 mg by mouth 2 (two) times daily.    Historical Provider,  MD  naproxen (NAPROSYN) 500 MG tablet Take 1 tablet (500 mg total) by mouth 2 (two) times daily. Patient not taking: Reported on 03/04/2014 05/16/13   Benny Lennert, MD   BP 112/74 mmHg  Pulse 84  Temp(Src) 98.5 F (36.9 C) (Oral)  Resp 17  SpO2 93% Physical Exam  Constitutional: He is oriented to person, place, and time. He appears well-developed and well-nourished. No distress.  overweight, no acute distress  HENT:  Head: Normocephalic and atraumatic.  Nose: Nose normal.  Mouth/Throat: Oropharynx is clear and moist.  Eyes: Conjunctivae and EOM are normal. Pupils are equal, round, and reactive to light.  Neck: Normal range of motion. Neck supple. No  JVD present. No tracheal deviation present. No thyromegaly present.  Cardiovascular: Normal rate, regular rhythm, normal heart sounds and intact distal pulses.  Exam reveals no gallop and no friction rub.   No murmur heard. Pulmonary/Chest: Effort normal and breath sounds normal. No stridor. No respiratory distress. He has no wheezes. He has no rales. He exhibits no tenderness.  Abdominal: Soft. Bowel sounds are normal. He exhibits no distension and no mass. There is no tenderness. There is no rebound and no guarding.  Musculoskeletal: Normal range of motion. He exhibits no edema or tenderness.  Lymphadenopathy:    He has no cervical adenopathy.  Neurological: He is alert and oriented to person, place, and time. He displays normal reflexes. He exhibits normal muscle tone. Coordination normal.  Skin: Skin is warm and dry. No rash noted. No erythema. No pallor.  Psychiatric: He has a normal mood and affect. His behavior is normal. Judgment and thought content normal.  Nursing note and vitals reviewed.   ED Course  Procedures (including critical care time)  DIAGNOSTIC STUDIES: Oxygen Saturation is 93% on RA, low by my interpretation.    COORDINATION OF CARE: 2:38 AM Discussed treatment plan with pt at bedside and pt agreed to plan.   Labs Review Labs Reviewed  BASIC METABOLIC PANEL - Abnormal; Notable for the following:    Glucose, Bld 106 (*)    All other components within normal limits  CBC  I-STAT TROPOININ, ED    Imaging Review Dg Chest Port 1 View  03/04/2014   CLINICAL DATA:  Acute chest pain.  Initial encounter.  EXAM: PORTABLE CHEST - 1 VIEW  COMPARISON:  11/29/2011 chest radiographs  FINDINGS: The cardiomediastinal silhouette is unremarkable.  There is no evidence of focal airspace disease, pulmonary edema, suspicious pulmonary nodule/mass, pleural effusion, or pneumothorax. No acute bony abnormalities are identified.  IMPRESSION: No active disease.   Electronically Signed    By: Laveda Abbe M.D.   On: 03/04/2014 02:12     EKG Interpretation None      Date: 03/04/2014  Rate: 99  Rhythm: normal sinus rhythm  QRS Axis: normal  Intervals: normal  ST/T Wave abnormalities: normal  Conduction Disutrbances:left anterior fascicular block  Narrative Interpretation:   Old EKG Reviewed: none available   MDM   Final diagnoses:  Chest pain, unspecified chest pain type   53 year old male, history of coronary disease status post PCI 2 in 2013, who presents with symptoms starting around 10 PM with indigestion, progressing to pressure and chest discomfort.  Patient continues to smoke a pack a day.  He reports feeling better after nitroglycerin.  No symptoms of CHF, no exertional angina.  Will discuss with his cardiologist once labs have resulted.  Case discussed with Dr. Sharyn Lull, he will admit.  Patient updated on findings  and plan.  I personally performed the services described in this documentation, which was scribed in my presence. The recorded information has been reviewed and is accurate.     Olivia Mackielga M Charlei Ramsaran, MD 03/04/14 [phone]

## 2014-03-04 NOTE — ED Notes (Signed)
I stat results not crossing over into Epic I stat Troponin results 0.00ng/mL

## 2014-03-04 NOTE — ED Notes (Signed)
Dr Otter at bedside  

## 2014-03-04 NOTE — Plan of Care (Signed)
Problem: Phase I Progression Outcomes Goal: Anginal pain relieved Outcome: Completed/Met Date Met:  03/04/14

## 2014-03-04 NOTE — ED Notes (Signed)
Presents with indigestion began this evening and turned into left sided chest tightness associated with SOB. Took 1 nitro at home with relief of pain. HX of MI, this pain feels the same. Nothing makes pain worse. Denies pain at this time.

## 2014-03-04 NOTE — ED Notes (Signed)
Nitropaste removed, verbal order from Dr. Norlene Campbelltter.

## 2014-03-04 NOTE — Progress Notes (Signed)
ANTICOAGULATION CONSULT NOTE - Follow-up  Pharmacy Consult for Heparin Indication: USAP  Allergies  Allergen Reactions  . Codeine Nausea And Vomiting    Patient Measurements: Heparin Dosing Weight: 85kg  Vital Signs: Temp: 98.4 F (36.9 C) (12/04 2011) Temp Source: Oral (12/04 2011) BP: 116/70 mmHg (12/04 2011) Pulse Rate: 81 (12/04 2011)  Labs:  Recent Labs  03/04/14 0140 03/04/14 0905 03/04/14 1121 03/04/14 1541 03/04/14 2007  HGB 15.0  --   --   --   --   HCT 45.2  --   --   --   --   PLT 206  --   --   --   --   HEPARINUNFRC  --   --  0.19*  --  0.45  CREATININE 0.80 0.84  --   --   --   TROPONINI  --  <0.30  --  <0.30 <0.30   Assessment: 53yo male c/o indigestion that turned into chest tightness associated w/ SOB continues on heparin for USAP. Heparin level is now therapeutic at 0.45. No bleeding noted.   Goal of Therapy:  Heparin level 0.3-0.7 units/ml Monitor platelets by anticoagulation protocol: Yes   Plan:  1. Continue heparin gtt 1250 units/hr 2. F/u AM heparin level to confirm dosing  Lysle Pearlachel Enis Riecke, PharmD, BCPS Pager # [phone] 03/04/2014 9:06 PM

## 2014-03-04 NOTE — Progress Notes (Signed)
ANTICOAGULATION CONSULT NOTE - Initial Consult  Pharmacy Consult for Heparin Indication: USAP  Allergies  Allergen Reactions  . Codeine Nausea And Vomiting    Patient Measurements: Heparin Dosing Weight: 85kg  Vital Signs: Temp: 97.8 F (36.6 C) (12/04 0554) Temp Source: Oral (12/04 0554) BP: 107/62 mmHg (12/04 1156) Pulse Rate: 72 (12/04 1156)  Labs:  Recent Labs  03/04/14 0140 03/04/14 0905 03/04/14 1121  HGB 15.0  --   --   HCT 45.2  --   --   PLT 206  --   --   HEPARINUNFRC  --   --  0.19*  CREATININE 0.80 0.84  --   TROPONINI  --  <0.30  --      Medical History: Past Medical History  Diagnosis Date  . Kidney stones 2006  . Myocardial infarction      Assessment: 53yo male c/o indigestion that turned into chest tightness associated w/ SOB, to begin heparin for USAP. Initial heparin level low at 0.19  Goal of Therapy:  Heparin level 0.3-0.7 units/ml Monitor platelets by anticoagulation protocol: Yes   Plan:  Heparin 3000 units iv bolus x 1 Heparin drip to 1250 units / hr 6 hour heparin level  Thank you. Okey RegalLisa Tayana Shankle, PharmD [phone]   03/04/2014,12:40 PM

## 2014-03-05 ENCOUNTER — Inpatient Hospital Stay (HOSPITAL_COMMUNITY): Payer: BC Managed Care – PPO

## 2014-03-05 LAB — COMPREHENSIVE METABOLIC PANEL
ALK PHOS: 61 U/L (ref 39–117)
ALT: 18 U/L (ref 0–53)
ANION GAP: 12 (ref 5–15)
AST: 16 U/L (ref 0–37)
Albumin: 3.5 g/dL (ref 3.5–5.2)
BUN: 15 mg/dL (ref 6–23)
CO2: 26 mEq/L (ref 19–32)
Calcium: 9.1 mg/dL (ref 8.4–10.5)
Chloride: 102 mEq/L (ref 96–112)
Creatinine, Ser: 0.82 mg/dL (ref 0.50–1.35)
GFR calc Af Amer: >90 mL/min (ref >90)
GFR calc non Af Amer: >90 mL/min (ref >90)
Glucose, Bld: 123 mg/dL — ABNORMAL HIGH (ref 70–99)
Potassium: 4.3 mEq/L (ref 3.7–5.3)
SODIUM: 140 meq/L (ref 137–147)
TOTAL PROTEIN: 6.4 g/dL (ref 6.0–8.3)
Total Bilirubin: 0.2 mg/dL — ABNORMAL LOW (ref 0.3–1.2)

## 2014-03-05 LAB — CBC
HEMATOCRIT: 43.3 % (ref 39.0–52.0)
Hemoglobin: 13.8 g/dL (ref 13.0–17.0)
MCH: 28.5 pg (ref 26.0–34.0)
MCHC: 31.9 g/dL (ref 30.0–36.0)
MCV: 89.5 fL (ref 78.0–100.0)
Platelets: 192 10*3/uL (ref 150–400)
RBC: 4.84 MIL/uL (ref 4.22–5.81)
RDW: 12.4 % (ref 11.5–15.5)
WBC: 7.6 10*3/uL (ref 4.0–10.5)

## 2014-03-05 LAB — LIPID PANEL
CHOLESTEROL: 107 mg/dL (ref 0–200)
HDL: 21 mg/dL — AB (ref >39)
LDL Cholesterol: 30 mg/dL (ref 0–99)
Total CHOL/HDL Ratio: 5.1 RATIO
Triglycerides: 278 mg/dL — ABNORMAL HIGH (ref <150)
VLDL: 56 mg/dL — ABNORMAL HIGH (ref 0–40)

## 2014-03-05 LAB — HEPARIN LEVEL (UNFRACTIONATED)
HEPARIN UNFRACTIONATED: 0.21 [IU]/mL — AB (ref 0.30–0.70)
Heparin Unfractionated: 0.4 IU/mL (ref 0.30–0.70)

## 2014-03-05 MED ORDER — HEPARIN BOLUS VIA INFUSION
1000.0000 [IU] | Freq: Once | INTRAVENOUS | Status: AC
  Administered 2014-03-05: 1000 [IU] via INTRAVENOUS
  Filled 2014-03-05: qty 1000

## 2014-03-05 MED ORDER — TECHNETIUM TC 99M SESTAMIBI GENERIC - CARDIOLITE
30.0000 | Freq: Once | INTRAVENOUS | Status: AC | PRN
  Administered 2014-03-05: 30 via INTRAVENOUS

## 2014-03-05 MED ORDER — REGADENOSON 0.4 MG/5ML IV SOLN
INTRAVENOUS | Status: AC
  Administered 2014-03-05: 0.4 mg
  Filled 2014-03-05: qty 5

## 2014-03-05 MED ORDER — TECHNETIUM TC 99M SESTAMIBI GENERIC - CARDIOLITE
10.0000 | Freq: Once | INTRAVENOUS | Status: AC | PRN
  Administered 2014-03-05: 10 via INTRAVENOUS

## 2014-03-05 NOTE — Plan of Care (Signed)
Problem: Phase I Progression Outcomes Goal: Other Phase I Outcomes/Goals Outcome: Completed/Met Date Met:  03/05/14  Problem: Phase II Progression Outcomes Goal: Hemodynamically stable Outcome: Completed/Met Date Met:  03/05/14 Goal: Anginal pain relieved Outcome: Completed/Met Date Met:  03/05/14 Goal: Stress Test if indicated Outcome: Completed/Met Date Met:  03/05/14

## 2014-03-05 NOTE — Progress Notes (Addendum)
  ANTICOAGULATION CONSULT NOTE - Follow Up Consult  Pharmacy Consult for Heparin Indication: CP/USA  Allergies  Allergen Reactions  . Codeine Nausea And Vomiting    Patient Measurements: Height: 5\' 4"  (162.6 cm) Weight: 192 lb 14.4 oz (87.5 kg) IBW/kg (Calculated) : 59.2 Heparin Dosing Weight: 78kg  Vital Signs: Temp: 97.6 F (36.4 C) (12/05 0529) Temp Source: Oral (12/05 0529) BP: 98/62 mmHg (12/05 0529) Pulse Rate: 85 (12/05 0529)  Labs:  Recent Labs  03/04/14 0140 03/04/14 0905 03/04/14 1121 03/04/14 1541 03/04/14 2007 03/05/14 0415  HGB 15.0  --   --   --   --  13.8  HCT 45.2  --   --   --   --  43.3  PLT 206  --   --   --   --  192  HEPARINUNFRC  --   --  0.19*  --  0.45 0.21*  CREATININE 0.80 0.84  --   --   --  0.82  TROPONINI  --  <0.30  --  <0.30 <0.30  --     Estimated Creatinine Clearance: 105.1 mL/min (by C-G formula based on Cr of 0.82).   Medications:  Heparin 1250 units/hr  Assessment: 53yom continues on heparin for CP/USA. Heparin level (0.21) trended down to subtherapeutic levels - will increase heparin rate and check follow-up level. - H/H and Plts wnl - No significant bleeding reported  Goal of Therapy:  Heparin level 0.3-0.7 units/ml Monitor platelets by anticoagulation protocol: Yes   Plan:  1. Heparin IV bolus 1000 units x 1 2. Increase heparin drip to 1450 units/hr (14.5 ml/hr) 3. Check heparin level 6 hours after rate increase 4. Daily heparin level and CBC  Cleon DewDulaney, [place]  [phone] 03/05/2014,8:22 AM   Addendum: Heparin level is now therapeutic at 0.40. Continue heparin at 1450 units/hr and follow up AM labs.  Louie CasaJennifer Dalores Weger, PharmD, BCPS 03/05/2014, 4:14 PM

## 2014-03-05 NOTE — Progress Notes (Signed)
MD notified regarding results of stress test.

## 2014-03-06 LAB — CBC
HEMATOCRIT: 42.3 % (ref 39.0–52.0)
Hemoglobin: 13.8 g/dL (ref 13.0–17.0)
MCH: 29 pg (ref 26.0–34.0)
MCHC: 32.6 g/dL (ref 30.0–36.0)
MCV: 88.9 fL (ref 78.0–100.0)
Platelets: 174 10*3/uL (ref 150–400)
RBC: 4.76 MIL/uL (ref 4.22–5.81)
RDW: 12.2 % (ref 11.5–15.5)
WBC: 7.5 10*3/uL (ref 4.0–10.5)

## 2014-03-06 LAB — HEPARIN LEVEL (UNFRACTIONATED): Heparin Unfractionated: <0.1 IU/mL — ABNORMAL LOW (ref 0.30–0.70)

## 2014-03-06 MED ORDER — ATORVASTATIN CALCIUM 80 MG PO TABS: 80.0000 mg | ORAL_TABLET | Freq: Every day | ORAL | Status: AC

## 2014-03-06 MED ORDER — NITROGLYCERIN 0.4 MG SL SUBL: 0.4000 mg | SUBLINGUAL_TABLET | SUBLINGUAL | Status: AC | PRN

## 2014-03-06 NOTE — Progress Notes (Signed)
Pt discharged per MD order and protocol. Discharge instructions reviewed with patient and all questions answered. Pt given all prescriptions and aware of follow up appointments.  

## 2014-03-06 NOTE — Plan of Care (Signed)
Problem: Phase III Progression Outcomes Goal: Hemodynamically stable Outcome: Completed/Met Date Met:  03/06/14 Goal: No anginal pain Outcome: Completed/Met Date Met:  03/06/14

## 2014-03-06 NOTE — Discharge Instructions (Signed)
Angina Pectoris  Angina pectoris, often just called angina, is extreme discomfort in your chest, neck, or arm caused by a lack of blood in the middle and thickest layer of your heart wall (myocardium). It may feel like tightness or heavy pressure. It may feel like a crushing or squeezing pain. Some people say it feels like gas or indigestion. It may go down your shoulders, back, and arms. Some people may have symptoms other than pain. These symptoms include fatigue, shortness of breath, cold sweats, or nausea. There are four different types of angina:  · Stable angina--Stable angina usually occurs in episodes of predictable frequency and duration. It usually is brought on by physical activity, emotional stress, or excitement. These are all times when the myocardium needs more oxygen. Stable angina usually lasts a few minutes and often is relieved by taking a medicine that can be taken under your tongue (sublingually). The medicine is called nitroglycerin. Stable angina is caused by a buildup of plaque inside the arteries, which restricts blood flow to the heart muscle (atherosclerosis).  · Unstable angina--Unstable angina can occur even when your body experiences little or no physical exertion. It can occur during sleep. It can also occur at rest. It can suddenly increase in severity or frequency. It might not be relieved by sublingual nitroglycerin. It can last up to 30 minutes. The most common cause of unstable angina is a blood clot that has developed on the top of plaque buildup inside a coronary artery. It can lead to a heart attack if the blood clot completely blocks the artery.  · Microvascular angina--This type of angina is caused by a disorder of tiny blood vessels called arterioles. Microvascular angina is more common in women. The pain may be more severe and last longer than other types of angina pectoris.  · Prinzmetal or variant angina--This type of angina pectoris usually occurs when your body  experiences little or no physical exertion. It especially occurs in the early morning hours. It is caused by a spasm of your coronary artery.  HOME CARE INSTRUCTIONS   · Only take over-the-counter and prescription medicines as directed by your health care provider.  · Stay active or increase your exercise as directed by your health care provider.  · Limit strenuous activity as directed by your health care provider.  · Limit heavy lifting as directed by your health care provider.  · Maintain a healthy weight.  · Learn about and eat heart-healthy foods.  · Do not use any tobacco products including cigarettes, chewing tobacco or electronic cigarettes.  SEEK IMMEDIATE MEDICAL CARE IF:   You experience the following symptoms:  · Chest, neck, deep shoulder, or arm pain or discomfort that lasts more than a few minutes.  · Chest, neck, deep shoulder, or arm pain or discomfort that goes away and comes back, repeatedly.  · Heavy sweating with discomfort, without a noticeable cause.  · Shortness of breath or difficulty breathing.  · Angina that does not get better after a few minutes of rest or after taking sublingual nitroglycerin.  These can all be symptoms of a heart attack, which is a medical emergency! Get medical help at once. Call your local emergency service (911 in [place] immediately. Do not  drive yourself to the hospital and do not  wait to for your symptoms to go away.  MAKE SURE YOU:  · Understand these instructions.  · Will watch your condition.  · Will get help right away if you are not   doing well or get worse.  Document Released: 03/18/2005 Document Revised: 03/23/2013 Document Reviewed: 07/20/2013  Exit[employer]® Patient Information ©2015 [employer]. This information is not intended to replace advice given to you by your health care provider. Make sure you discuss any questions you have with your health care provider.

## 2014-03-06 NOTE — Progress Notes (Signed)
Utilization Review Completed.Eddie Lewis T12/08/2013  

## 2014-03-06 NOTE — Discharge Summary (Signed)
Discharge summary dictated on 03/06/2014 dictation number is [phone]

## 2014-03-06 NOTE — Discharge Summary (Signed)
NAMFredia Lewis:  Parcel, [place]               ACCOUNT NO.:  [id number]  MEDICAL RECORD NO.:  [id number]  LOCATION:  [id number]                        FACILITY:  [facility]  PHYSICIAN:  Eduardo OsierMohan N. Sharyn LullHarwani, M.D. DATE OF BIRTH:  [birth year]  DATE OF ADMISSION:  03/04/2014 DATE OF DISCHARGE:  03/06/2014                              DISCHARGE SUMMARY   ADMITTING DIAGNOSES: 1. Unstable angina, rule out myocardial infarction. 2. Coronary artery disease, history of anterolateral wall myocardial     infarction in the past status post percutaneous coronary     intervention to left anterior descending coronary artery and right     coronary artery in the past. 3. Hypertension. 4. Hypercholesteremia. 5. Glucose intolerance. 6. Morbid obesity. 7. Tobacco abuse.  FINAL DIAGNOSES: 1. Stable angina.  Negative nuclear stress test. 2. Coronary artery disease, history of anterolateral wall myocardial     infarction in August 2013, status post percutaneous coronary     intervention to left anterior descending coronary artery and right     coronary artery. 3. Hypertension. 4. Glucose intolerance. 5. Hypercholesteremia. 6. Morbid obesity. 7. Tobacco abuse.  DISCHARGE HOME MEDICATIONS: 1. Aspirin 81 mg 1 tablet daily. 2. Atorvastatin 80 mg 1 tablet daily. 3. Metoprolol succinate 25 mg daily. 4. Ramipril 2.5 mg 1 capsule daily. 5. Nitrostat 0.4 mg sublingual use as directed.  DIET:  Low-salt, low-cholesterol, 1800 calories ADA diet.  The patient has been advised to monitor blood pressure daily.  The patient has been discussed at length regarding lifestyle changes and smoking cessation and weight loss and exercise.  CONDITION AT DISCHARGE:  Stable.  FOLLOWUP:  With me in 1 week.  BRIEF HISTORY AND HOSPITAL COURSE:  Eddie Lewis is a 53 year old male with past medical history significant for coronary artery disease, history of anterolateral wall myocardial infarction in August 2013, presented with STEMI, noted to  have 100% occluded LAD and had PCI to 100% occluded LAD followed by PCI to distal RCA, hypertension, hypercholesteremia, glucose intolerance, morbid obesity, tobacco abuse, continues to smoke, came to ER complaining of retrosternal and left- sided chest pain radiating to the left shoulder, grade 4/10 which woke him up from sleep, patient took 1 sublingual nitro with partial relief, so decided to come to ED.  The patient denies any nausea, vomiting, diaphoresis.  Denies palpitation, lightheadedness, or syncope.  States he gets exertional chest pain off and on lasting few minutes, relieves with rest, off and on.  The patient was started on IV heparin, nitrates in ED with relief of chest pain.  The patient presently denies any chest pain.  First set of cardiac enzyme was negative.  EKG showed no acute ischemic changes.  PHYSICAL EXAMINATION:  GENERAL:  On examination, he was alert, awake, oriented x3. VITAL SIGNS:  Blood pressure was 94/67, pulse was 76.  He was afebrile. HEENT:  Conjunctiva was pink. NECK:  Supple.  No JVD.  No bruit. LUNGS:  Clear to auscultation without rhonchi or rales. CARDIOVASCULAR:  S1, S2 was normal.  There was soft systolic murmur.  No S3, gallop. ABDOMEN:  Soft.  Bowel sounds were present, nontender. EXTREMITIES:  There is no clubbing, cyanosis, or edema. NEUROLOGIC:  Grossly intact.  LABORATORY DATA:  Sodium was 140, potassium 4.4, BUN 16, creatinine 0.80, glucose was 106.  Hemoglobin was 15, hematocrit 45.2, white count of 9.8.  His 3 sets of troponin-I were normal.  Cholesterol was 107, triglycerides were elevated to 78, HDL was low 21, LDL was 30.  His hemoglobin A1c was 6.4.  Lexiscan Myoview showed no decreased activity in the left ventricle on stress imaging and suggesting reversible ischemia or infarction.  Normal wall motion.  EF of 52%.  BRIEF HOSPITAL COURSE:  The patient was admitted to telemetry unit.  MI was ruled out by serial enzymes and  EKG.  The patient subsequently underwent Lexiscan Myoview which showed no evidence of ischemia or infarction with EF of 52%.  The patient is off heparin and IV nitrates. The patient is ambulating in room and hallway without any problems.  The patient will be discharged home on above medications and will be followed up in my office in 1 week.     Eduardo OsierMohan N. Sharyn LullHarwani, M.D.     [facility]  D:  03/06/2014  T:  03/06/2014  Job:  [id number]

## 2014-03-10 ENCOUNTER — Encounter (HOSPITAL_COMMUNITY): Payer: Self-pay | Admitting: Cardiology

## 2014-09-30 ENCOUNTER — Emergency Department (HOSPITAL_COMMUNITY)
Admission: EM | Admit: 2014-09-30 | Discharge: 2014-10-01 | Disposition: A | Discharge status: Home / Self Care | Payer: BLUE CROSS/BLUE SHIELD | Attending: Emergency Medicine | Admitting: Emergency Medicine

## 2014-09-30 ENCOUNTER — Encounter (HOSPITAL_COMMUNITY): Payer: Self-pay | Admitting: *Deleted

## 2014-09-30 ENCOUNTER — Emergency Department (HOSPITAL_COMMUNITY): Payer: BLUE CROSS/BLUE SHIELD

## 2014-09-30 DIAGNOSIS — K5909 Other constipation: Secondary | ICD-10-CM | POA: Diagnosis not present

## 2014-09-30 DIAGNOSIS — I252 Old myocardial infarction: Secondary | ICD-10-CM | POA: Insufficient documentation

## 2014-09-30 DIAGNOSIS — Z79899 Other long term (current) drug therapy: Secondary | ICD-10-CM | POA: Diagnosis not present

## 2014-09-30 DIAGNOSIS — Z720 Tobacco use: Secondary | ICD-10-CM | POA: Diagnosis not present

## 2014-09-30 DIAGNOSIS — R079 Chest pain, unspecified: Secondary | ICD-10-CM | POA: Diagnosis present

## 2014-09-30 DIAGNOSIS — Z87442 Personal history of urinary calculi: Secondary | ICD-10-CM | POA: Insufficient documentation

## 2014-09-30 DIAGNOSIS — Z7982 Long term (current) use of aspirin: Secondary | ICD-10-CM | POA: Diagnosis not present

## 2014-09-30 DIAGNOSIS — F439 Reaction to severe stress, unspecified: Secondary | ICD-10-CM

## 2014-09-30 LAB — I-STAT CHEM 8, ED
BUN: 14 mg/dL (ref 6–20)
CHLORIDE: 101 mmol/L (ref 101–111)
CREATININE: 1.1 mg/dL (ref 0.61–1.24)
Calcium, Ion: 1.17 mmol/L (ref 1.12–1.23)
GLUCOSE: 110 mg/dL — AB (ref 65–99)
HCT: 47 % (ref 39.0–52.0)
Hemoglobin: 16 g/dL (ref 13.0–17.0)
Potassium: 3.9 mmol/L (ref 3.5–5.1)
Sodium: 141 mmol/L (ref 135–145)
TCO2: 29 mmol/L (ref 0–100)

## 2014-09-30 LAB — I-STAT TROPONIN, ED: Troponin i, poc: 0 ng/mL (ref 0.00–0.08)

## 2014-09-30 LAB — CBC WITH DIFFERENTIAL/PLATELET
BASOS PCT: 0 % (ref 0–1)
Basophils Absolute: 0 10*3/uL (ref 0.0–0.1)
EOS ABS: 0.2 10*3/uL (ref 0.0–0.7)
Eosinophils Relative: 3 % (ref 0–5)
HEMATOCRIT: 44.2 % (ref 39.0–52.0)
Hemoglobin: 14.7 g/dL (ref 13.0–17.0)
LYMPHS ABS: 1.9 10*3/uL (ref 0.7–4.0)
Lymphocytes Relative: 24 % (ref 12–46)
MCH: 29.6 pg (ref 26.0–34.0)
MCHC: 33.3 g/dL (ref 30.0–36.0)
MCV: 88.9 fL (ref 78.0–100.0)
Monocytes Absolute: 0.6 10*3/uL (ref 0.1–1.0)
Monocytes Relative: 7 % (ref 3–12)
NEUTROS ABS: 5 10*3/uL (ref 1.7–7.7)
Neutrophils Relative %: 66 % (ref 43–77)
Platelets: 194 10*3/uL (ref 150–400)
RBC: 4.97 MIL/uL (ref 4.22–5.81)
RDW: 12.8 % (ref 11.5–15.5)
WBC: 7.7 10*3/uL (ref 4.0–10.5)

## 2014-09-30 NOTE — ED Notes (Signed)
Pt sent to XRAY, smiling and joking with family at bedside at this time.
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
## 2014-09-30 NOTE — ED Notes (Signed)
The pt saw dr Sharyn Lullharwani earlier today  For a different reason and everything was ok
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
## 2014-10-01 ENCOUNTER — Encounter (HOSPITAL_COMMUNITY): Payer: Self-pay | Admitting: Emergency Medicine

## 2014-10-01 LAB — I-STAT TROPONIN, ED: Troponin i, poc: 0.02 ng/mL (ref 0.00–0.08)

## 2014-10-01 MED ORDER — POLYETHYLENE GLYCOL 3350 17 GM/SCOOP PO POWD: 17.0000 g | Freq: Every day | ORAL | Status: DC

## 2014-10-01 MED ORDER — GI COCKTAIL ~~LOC~~
30.0000 mL | Freq: Once | ORAL | Status: AC
  Administered 2014-10-01: 30 mL via ORAL
  Filled 2014-10-01: qty 30

## 2014-10-01 NOTE — ED Notes (Signed)
Pt. Left with all belongings and refused wheelchair 

## 2014-10-01 NOTE — Discharge Instructions (Signed)
°Emergency Department Resource Guide °1) Find a Doctor and Pay Out of Pocket °Although you won't have to find out who is covered by your insurance plan, it is a good idea to ask around and get recommendations. You will then need to call the office and see if the doctor you have chosen will accept you as a new patient and what types of options they offer for patients who are self-pay. Some doctors offer discounts or will set up payment plans for their patients who do not have insurance, but you will need to ask so you aren't surprised when you get to your appointment. ° °2) Contact Your Local Health Department °Not all health departments have doctors that can see patients for sick visits, but many do, so it is worth a call to see if yours does. If you don't know where your local health department is, you can check in your phone book. The CDC also has a tool to help you locate your state's health department, and many state websites also have listings of all of their local health departments. ° °3) Find a Walk-in Clinic °If your illness is not likely to be very severe or complicated, you may want to try a walk in clinic. These are popping up all over the country in pharmacies, drugstores, and shopping centers. They're usually staffed by nurse practitioners or physician assistants that have been trained to treat common illnesses and complaints. They're usually fairly quick and inexpensive. However, if you have serious medical issues or chronic medical problems, these are probably not your best option. ° °No Primary Care Doctor: °- Call [facility] Connect at  832-8000 - they can help you locate a primary care doctor that  accepts your insurance, provides certain services, etc. °- Physician Referral Service- 1-800-533-3463 ° °Chronic Pain Problems: °Organization         Address  Phone   Notes  °[facility]  (336) 297-2271 Patients need to be referred by their primary care doctor.  ° °Medication  Assistance: °Organization         Address  Phone   Notes  °[place] [facility] [address] °[place], [place] [address] (336) 641-8030 --Must be a resident of [place] °-- Must have NO insurance coverage whatsoever (no Medicaid/ Medicare, etc.) °-- The pt. MUST have a primary care doctor that directs their care regularly and follows them in the community °  °[facility]  (866) 331-1348   °[employer]  (888) 892-1162   ° °Agencies that provide inexpensive medical care: °Organization         Address  Phone   Notes  °[facility]  (336) 832-8035   ° Internal Medicine    (336) 832-7272   °[facility] [address] °[place], [place] [address] (336) 832-4777   °[facility] [address], °[place] (336) 271-4999   °[facility]    (336) 373-0678   °[facility]    (336) 272-1050   °[facility] ° [address], [place] Phone:  (336) 832-4444, Fax:  (336) 832-4440 Hours of Operation:  9 am - 6 pm, M-F.  Also accepts Medicaid/Medicare and self-pay.  °[facility] ° [address], [place] Phone: (336) 832-3150, Fax: (336) 832-3151. Hours of Operation:  8:30 am - 5:30 pm, M-F.  Also accepts Medicaid and self-pay.  °[facility] [place] [address], [place] Phone: (336) 878-6027   °[facility] [address], [place], [place] (336)723-1848, Ext. 123 Mondays & Thursdays: 7-9 AM.  First 15 patients are seen on a first come, first serve basis. °  ° °Medicaid-accepting [place] Providers: ° °Organization         Address  Phone   Notes  °[facility] [address], [place] (336) 641-2100 Also accepts self-pay patients.  °[facility] [address], [place] ° (336) 856-9996   °[facility] [address], [place]  (336) 288-8857   °[facility] [address] [place] [address], [place] (336) 299-7000   °Veita Bland [address], [place]  ° (336) 373-1557 Only accepts [place] Access Medicaid patients after they have their name applied to their card.  ° °Self-Pay (no insurance) in [place]: ° °Organization         Address  Phone   Notes  °Sickle Cell Patients, [facility] [address], [place] (336) 832-1970   °[facility] [address], [place] (336) 832-4400   °[facility] ° [address], [place] (336) 992-4800   °[facility]/Dr. Osei-Bonsu ° [address] [place] [address], [place] or [address], [place] (336) 841-8500 Phone number for both [place] and [place] locations is the same.  °[facility] [address], [place] (336) 299-0000   °[facility] [address] [place] [address], [place] or [address] (336) 852-7530 °(336) 878-2260   °[facility] [address], [place] (336) 350-1642, phone; (336) 294-5005, fax Sees patients 1st and 3rd Saturday of every month.  Must not qualify for public or private insurance (i.e. Medicaid, Medicare, [place] Health Choice, Veterans' Benefits) • Household income should be no more than 200% of the poverty level •The clinic cannot treat you if you are pregnant or think you are pregnant • Sexually transmitted diseases are not treated at the clinic.  ° ° °Dental Care: °Organization         Address  Phone  Notes  °[place] [facility] [address], [place] (336) 641-6152 Accepts children up to age 21 who are enrolled in Medicaid or [place] Health Choice; pregnant women with a Medicaid card; and children who have applied for Medicaid or [place] Health Choice, but were declined, whose parents can pay a reduced fee at time of service.  °[place]  [facility] [place]  [address], [place] (336) 641-7733 Accepts children up to age 21 who are enrolled in Medicaid or [place] Health Choice; pregnant women with a Medicaid card; and children who have applied for Medicaid or [place] Health Choice, but were declined, whose parents can pay a reduced fee at time of service.  °[facility] ° [address],  (336) 641-4533 Patients are seen by appointment only. Walk-ins are not accepted. [facility] will see patients 18 years of age and older. °Monday - Tuesday (8am-5pm) °Most Wednesdays (8:30-5pm) °$30 per visit, cash only  °[facility] ° [address], [place] (336) 641-4533 Patients are seen by appointment only. Walk-ins are not accepted. [facility] will see patients 18 years of age and older. °One   Wednesday Evening (Monthly: Volunteer Based).  $30 per visit, cash only  °[facility]  (919) 537-3737 for adults; Children under age 4, call Graduate Pediatric Dentistry at (919) [phone]. Children aged 4-14, please call (919) 537-3737 to request a pediatric application. ° Dental services are provided in all areas of dental care including fillings, crowns and bridges, complete and partial dentures, implants, gum treatment, root canals, and extractions. Preventive care is also provided. Treatment is provided to both adults and children. °Patients are selected via a lottery and there is often a waiting list. °  °[facility] [address], °[place] ° (336) 763-8833 [email or web address] °  °[facility] [address], [place], [place] (336)723-1848, Ext. 123 Second and Fourth Thursday of each month, opens at 6:30 AM; Clinic ends at 9 AM.  Patients are seen on a first-come first-served basis, and a limited number are seen during each clinic.  ° °[facility] ° [address], [place], [place] (336) 723-7904    Eligibility Requirements °You must have lived in [place], [place], or [place] counties for at least the last three months. °  You cannot be eligible for state or federal sponsored healthcare insurance, including Veterans Administration, Medicaid, or Medicare. °  You generally cannot be eligible for healthcare insurance through your employer.  °  How to apply: °Eligibility screenings are held every Tuesday and Wednesday afternoon from 1:00 pm until 4:00 pm. You do not need an appointment for the interview!  °[facility] [address], [place], [place] 336-631-2330   °[facility] [facility]  336-342-8273   °[place]  336-703-3100   °[facility]  336-570-6415   ° °Behavioral Health Resources in the Community: °Intensive Outpatient Programs °Organization         Address  Phone  Notes  °[facility] [address], [place], [place] 336-878-6098   °[facility] [address], [place], [place] 336-832-9800   °[facility] [address], [place], [place] ° 336-882-2125   °[facility] [address],  °[place], [place] 1-800-853-5163 or 336-641-4981   °Substance Abuse Resources °Organization         Address  Phone  Notes  °[facility]  336-882-2125   °[facility]  336-784-9470   °[facility]  336-285-9073   °[facility]  336-845-3988   °Residential & Outpatient Substance Abuse Program  1-800-659-3381   °Psychological Services °Organization         Address  Phone  Notes  °[facility]  336- 832-9600   °[facility]  336- 378-7881   °[facility] [address], [place] 1-800-853-5163 or 336-641-4981   ° °Mobile Crisis Teams °Organization         Address  Phone  Notes  °[facility], Mobile Crisis Care Unit  1-877-626-1772   °Assertive °[facility] ° [address]  [place], [place] 336-834-9664   °Sharon DeEsch [address] °[place] 336-554-5454   ° °Self-Help/Support Groups °Organization         Address  Phone             Notes  °[facility] [place] - variety of support groups  336- 373-1402 Call for more information  °Narcotics Anonymous (NA), [facility] [address], °[place]  2 meetings at this location  ° °  Residential Treatment Programs °Organization         Address  Phone  Notes  °[facility] [address],    °[place]  1-866-801-8205   °[facility] ° [address], [place], [place] 704-293-8524   °[facility] [address], [place] 336-845-3988 Admissions: 8am-3pm M-F  °[facility] [address]    °[place], [place] 336-841-1104   °[facility] [address], [place], [place] 336-379-7146   °[facility] [address]  °[place], [place] 336-285-9073   °[facility] - Intensive Outpatient [address], [place], [place] 336-852-3033   °[facility] [address]  °[place], [place] 1-877-615-2722 or 336-784-9470   °[facility] [address] [place], [place] 336-227-7417 Accepts Medicaid  °[facility] [address]  °[place] 1-800-659-3381 Substance Abuse/Addiction Treatment  ° °[place] Behavioral Health Resources °Organization         Address  Phone  Notes  °[facility]  (888) 581-9988   °Julie Brannon, PhD [address] [place], [place]   (336) 349-5553 or (336) 951-0000   °[facility]   [address] °[place], [place] (336) 349-4454   °[facility] [address], [place], [place] (336) 342-8316 Insurance/Medicaid/sponsorship through [facility]  °[facility] [address]                                    [place], [place] (336) 342-8316 Therapy/tele-psych/case    °[facility] [address]  ° [place], [place] (336) 349-2233    °Dr. Arfeen  (336) 349-4544   °[facility] [place]  [employer] [place] [facility] 1) [address], [place] °2) [address], [place] °3)  [address], [place] (336) 349-3220 °(336) 342-7768 ° °(336) 342-8140   °[place] [facility] (336) 342-1394 or (336) 342-3537 (After Hours)    ° ° °

## 2014-10-01 NOTE — ED Notes (Signed)
Pt. Standing up having detailed conversation with family.

## 2018-03-17 ENCOUNTER — Inpatient Hospital Stay (HOSPITAL_COMMUNITY)
Admission: EM | Admit: 2018-03-17 | Discharge: 2018-03-19 | DRG: 247 | Disposition: A | Discharge status: Home / Self Care | Payer: BLUE CROSS/BLUE SHIELD | Attending: Cardiology | Admitting: Cardiology

## 2018-03-17 ENCOUNTER — Encounter (HOSPITAL_COMMUNITY): Payer: Self-pay

## 2018-03-17 ENCOUNTER — Other Ambulatory Visit: Payer: Self-pay

## 2018-03-17 ENCOUNTER — Emergency Department (HOSPITAL_COMMUNITY): Payer: BLUE CROSS/BLUE SHIELD

## 2018-03-17 ENCOUNTER — Inpatient Hospital Stay (HOSPITAL_COMMUNITY)
Admission: EM | Disposition: A | Discharge status: Home / Self Care | Payer: Self-pay | Source: Home / Self Care | Attending: Cardiology

## 2018-03-17 DIAGNOSIS — Z9114 Patient's other noncompliance with medication regimen: Secondary | ICD-10-CM | POA: Diagnosis not present

## 2018-03-17 DIAGNOSIS — I213 ST elevation (STEMI) myocardial infarction of unspecified site: Secondary | ICD-10-CM | POA: Diagnosis present

## 2018-03-17 DIAGNOSIS — Z885 Allergy status to narcotic agent status: Secondary | ICD-10-CM

## 2018-03-17 DIAGNOSIS — Z7982 Long term (current) use of aspirin: Secondary | ICD-10-CM | POA: Diagnosis not present

## 2018-03-17 DIAGNOSIS — E785 Hyperlipidemia, unspecified: Secondary | ICD-10-CM | POA: Diagnosis present

## 2018-03-17 DIAGNOSIS — I2111 ST elevation (STEMI) myocardial infarction involving right coronary artery: Secondary | ICD-10-CM | POA: Diagnosis not present

## 2018-03-17 DIAGNOSIS — I2119 ST elevation (STEMI) myocardial infarction involving other coronary artery of inferior wall: Principal | ICD-10-CM | POA: Diagnosis present

## 2018-03-17 DIAGNOSIS — Z6835 Body mass index (BMI) 35.0-35.9, adult: Secondary | ICD-10-CM

## 2018-03-17 DIAGNOSIS — I2510 Atherosclerotic heart disease of native coronary artery without angina pectoris: Secondary | ICD-10-CM | POA: Diagnosis present

## 2018-03-17 DIAGNOSIS — I5041 Acute combined systolic (congestive) and diastolic (congestive) heart failure: Secondary | ICD-10-CM | POA: Clinically undetermined

## 2018-03-17 DIAGNOSIS — Z955 Presence of coronary angioplasty implant and graft: Secondary | ICD-10-CM | POA: Diagnosis not present

## 2018-03-17 DIAGNOSIS — Z7902 Long term (current) use of antithrombotics/antiplatelets: Secondary | ICD-10-CM | POA: Diagnosis not present

## 2018-03-17 DIAGNOSIS — I25110 Atherosclerotic heart disease of native coronary artery with unstable angina pectoris: Secondary | ICD-10-CM | POA: Diagnosis present

## 2018-03-17 DIAGNOSIS — F17210 Nicotine dependence, cigarettes, uncomplicated: Secondary | ICD-10-CM | POA: Diagnosis present

## 2018-03-17 DIAGNOSIS — I10 Essential (primary) hypertension: Secondary | ICD-10-CM | POA: Diagnosis present

## 2018-03-17 DIAGNOSIS — I361 Nonrheumatic tricuspid (valve) insufficiency: Secondary | ICD-10-CM | POA: Diagnosis not present

## 2018-03-17 DIAGNOSIS — Z79899 Other long term (current) drug therapy: Secondary | ICD-10-CM | POA: Diagnosis not present

## 2018-03-17 DIAGNOSIS — I252 Old myocardial infarction: Secondary | ICD-10-CM

## 2018-03-17 DIAGNOSIS — Z716 Tobacco abuse counseling: Secondary | ICD-10-CM

## 2018-03-17 DIAGNOSIS — Z87442 Personal history of urinary calculi: Secondary | ICD-10-CM | POA: Diagnosis not present

## 2018-03-17 DIAGNOSIS — R7303 Prediabetes: Secondary | ICD-10-CM | POA: Diagnosis present

## 2018-03-17 HISTORY — PX: CORONARY/GRAFT ACUTE MI REVASCULARIZATION: CATH118305

## 2018-03-17 HISTORY — PX: LEFT HEART CATH AND CORONARY ANGIOGRAPHY: CATH118249

## 2018-03-17 LAB — I-STAT CHEM 8, ED
BUN: 15 mg/dL (ref 6–20)
Calcium, Ion: 1.14 mmol/L — ABNORMAL LOW (ref 1.15–1.40)
Chloride: 98 mmol/L (ref 98–111)
Creatinine, Ser: 1 mg/dL (ref 0.61–1.24)
Glucose, Bld: 171 mg/dL — ABNORMAL HIGH (ref 70–99)
HCT: 51 % (ref 39.0–52.0)
Hemoglobin: 17.3 g/dL — ABNORMAL HIGH (ref 13.0–17.0)
Potassium: 3.7 mmol/L (ref 3.5–5.1)
Sodium: 138 mmol/L (ref 135–145)
TCO2: 32 mmol/L (ref 22–32)

## 2018-03-17 LAB — COMPREHENSIVE METABOLIC PANEL
ALT: 31 U/L (ref 0–44)
AST: 27 U/L (ref 15–41)
Albumin: 4.3 g/dL (ref 3.5–5.0)
Alkaline Phosphatase: 49 U/L (ref 38–126)
Anion gap: 12 (ref 5–15)
BUN: 14 mg/dL (ref 6–20)
CO2: 26 mmol/L (ref 22–32)
Calcium: 9.3 mg/dL (ref 8.9–10.3)
Chloride: 101 mmol/L (ref 98–111)
Creatinine, Ser: 0.89 mg/dL (ref 0.61–1.24)
GFR calc Af Amer: >60 mL/min (ref >60)
GFR calc non Af Amer: >60 mL/min (ref >60)
GLUCOSE: 176 mg/dL — AB (ref 70–99)
Potassium: 3.6 mmol/L (ref 3.5–5.1)
Sodium: 139 mmol/L (ref 135–145)
Total Bilirubin: 0.6 mg/dL (ref 0.3–1.2)
Total Protein: 7.6 g/dL (ref 6.5–8.1)

## 2018-03-17 LAB — LIPID PANEL
CHOL/HDL RATIO: 6.1 ratio
Cholesterol: 196 mg/dL (ref 0–200)
HDL: 32 mg/dL — ABNORMAL LOW (ref >40)
LDL Cholesterol: 96 mg/dL (ref 0–99)
Triglycerides: 340 mg/dL — ABNORMAL HIGH (ref <150)
VLDL: 68 mg/dL — AB (ref 0–40)

## 2018-03-17 LAB — CBC WITH DIFFERENTIAL/PLATELET
ABS IMMATURE GRANULOCYTES: 0.1 10*3/uL — AB (ref 0.00–0.07)
Basophils Absolute: 0.1 10*3/uL (ref 0.0–0.1)
Basophils Relative: 1 %
Eosinophils Absolute: 0.2 10*3/uL (ref 0.0–0.5)
Eosinophils Relative: 2 %
HCT: 51.3 % (ref 39.0–52.0)
Hemoglobin: 16.2 g/dL (ref 13.0–17.0)
Immature Granulocytes: 1 %
Lymphocytes Relative: 26 %
Lymphs Abs: 2.8 10*3/uL (ref 0.7–4.0)
MCH: 28.6 pg (ref 26.0–34.0)
MCHC: 31.6 g/dL (ref 30.0–36.0)
MCV: 90.6 fL (ref 80.0–100.0)
Monocytes Absolute: 0.9 10*3/uL (ref 0.1–1.0)
Monocytes Relative: 8 %
NEUTROS ABS: 6.9 10*3/uL (ref 1.7–7.7)
Neutrophils Relative %: 62 %
Platelets: 226 10*3/uL (ref 150–400)
RBC: 5.66 MIL/uL (ref 4.22–5.81)
RDW: 12.4 % (ref 11.5–15.5)
WBC: 10.9 10*3/uL — ABNORMAL HIGH (ref 4.0–10.5)
nRBC: 0 % (ref 0.0–0.2)

## 2018-03-17 LAB — PROTIME-INR
INR: 1.04
PROTHROMBIN TIME: 13.5 s (ref 11.4–15.2)

## 2018-03-17 LAB — I-STAT CG4 LACTIC ACID, ED: Lactic Acid, Venous: 2.23 mmol/L (ref 0.5–1.9)

## 2018-03-17 LAB — MRSA PCR SCREENING: MRSA by PCR: NEGATIVE

## 2018-03-17 LAB — TROPONIN I: Troponin I: 0.12 ng/mL (ref <0.03)

## 2018-03-17 LAB — APTT: aPTT: 27 seconds (ref 24–36)

## 2018-03-17 LAB — I-STAT TROPONIN, ED: Troponin i, poc: 0.11 ng/mL (ref 0.00–0.08)

## 2018-03-17 LAB — LACTIC ACID, PLASMA: Lactic Acid, Venous: 1.8 mmol/L (ref 0.5–1.9)

## 2018-03-17 LAB — POCT ACTIVATED CLOTTING TIME: Activated Clotting Time: 324 seconds

## 2018-03-17 SURGERY — LEFT HEART CATH AND CORONARY ANGIOGRAPHY
Anesthesia: LOCAL

## 2018-03-17 MED ORDER — DOPAMINE-DEXTROSE 3.2-5 MG/ML-% IV SOLN
INTRAVENOUS | Status: AC
  Filled 2018-03-17: qty 250

## 2018-03-17 MED ORDER — MIDAZOLAM HCL 2 MG/2ML IJ SOLN
INTRAMUSCULAR | Status: AC
  Filled 2018-03-17: qty 2

## 2018-03-17 MED ORDER — DOPAMINE-DEXTROSE 3.2-5 MG/ML-% IV SOLN
INTRAVENOUS | Status: DC | PRN
  Administered 2018-03-17: 5 ug/kg/min via INTRAVENOUS

## 2018-03-17 MED ORDER — HEPARIN (PORCINE) IN NACL 1000-0.9 UT/500ML-% IV SOLN
INTRAVENOUS | Status: DC | PRN
  Administered 2018-03-17 (×2): 500 mL

## 2018-03-17 MED ORDER — MORPHINE SULFATE (PF) 10 MG/ML IV SOLN
INTRAVENOUS | Status: AC
  Filled 2018-03-17: qty 1

## 2018-03-17 MED ORDER — HEPARIN SODIUM (PORCINE) 5000 UNIT/ML IJ SOLN: 60.0000 [IU]/kg | Freq: Once | INTRAMUSCULAR | Status: DC

## 2018-03-17 MED ORDER — METOPROLOL SUCCINATE ER 25 MG PO TB24: 25.0000 mg | ORAL_TABLET | Freq: Every day | ORAL | Status: DC

## 2018-03-17 MED ORDER — FENTANYL CITRATE (PF) 100 MCG/2ML IJ SOLN
INTRAMUSCULAR | Status: AC
  Filled 2018-03-17: qty 2

## 2018-03-17 MED ORDER — CANGRELOR BOLUS VIA INFUSION
INTRAVENOUS | Status: DC | PRN
  Administered 2018-03-17: 2721 ug via INTRAVENOUS

## 2018-03-17 MED ORDER — SODIUM CHLORIDE 0.9 % IV SOLN
INTRAVENOUS | Status: AC | PRN
  Administered 2018-03-17: 4 ug/kg/min via INTRAVENOUS
  Administered 2018-03-17: 0.75 ug/kg/min via INTRAVENOUS

## 2018-03-17 MED ORDER — LIDOCAINE HCL (PF) 1 % IJ SOLN
INTRAMUSCULAR | Status: DC | PRN
  Administered 2018-03-17: 2 mL

## 2018-03-17 MED ORDER — ATORVASTATIN CALCIUM 80 MG PO TABS
80.0000 mg | ORAL_TABLET | Freq: Every day | ORAL | Status: DC
  Administered 2018-03-17 – 2018-03-18 (×2): 80 mg via ORAL
  Filled 2018-03-17 (×3): qty 1

## 2018-03-17 MED ORDER — METOPROLOL SUCCINATE ER 25 MG PO TB24
25.0000 mg | ORAL_TABLET | Freq: Every day | ORAL | Status: DC
  Administered 2018-03-17 – 2018-03-19 (×3): 25 mg via ORAL
  Filled 2018-03-17 (×4): qty 1

## 2018-03-17 MED ORDER — TICAGRELOR 90 MG PO TABS: 90.0000 mg | ORAL_TABLET | Freq: Two times a day (BID) | ORAL | Status: DC

## 2018-03-17 MED ORDER — FENTANYL CITRATE (PF) 100 MCG/2ML IJ SOLN
INTRAMUSCULAR | Status: DC | PRN
  Administered 2018-03-17: 50 ug via INTRAVENOUS

## 2018-03-17 MED ORDER — LIDOCAINE HCL (PF) 1 % IJ SOLN
INTRAMUSCULAR | Status: AC
  Filled 2018-03-17: qty 30

## 2018-03-17 MED ORDER — ATROPINE SULFATE 1 MG/10ML IJ SOSY
PREFILLED_SYRINGE | INTRAMUSCULAR | Status: AC
  Filled 2018-03-17: qty 10

## 2018-03-17 MED ORDER — VERAPAMIL HCL 2.5 MG/ML IV SOLN
INTRAVENOUS | Status: DC | PRN
  Administered 2018-03-17: 10 mL via INTRA_ARTERIAL

## 2018-03-17 MED ORDER — MORPHINE SULFATE (PF) 2 MG/ML IV SOLN
INTRAVENOUS | Status: DC | PRN
  Administered 2018-03-17: 2 mg via INTRAVENOUS

## 2018-03-17 MED ORDER — FUROSEMIDE 10 MG/ML IJ SOLN
INTRAMUSCULAR | Status: DC | PRN
  Administered 2018-03-17: 40 mg via INTRAVENOUS

## 2018-03-17 MED ORDER — ASPIRIN EC 81 MG PO TBEC
81.0000 mg | DELAYED_RELEASE_TABLET | Freq: Every day | ORAL | Status: DC
  Administered 2018-03-18 – 2018-03-19 (×2): 81 mg via ORAL
  Filled 2018-03-17 (×3): qty 1

## 2018-03-17 MED ORDER — SODIUM CHLORIDE 0.9 % IV SOLN
INTRAVENOUS | Status: DC
  Administered 2018-03-17: 20 mL via INTRAVENOUS

## 2018-03-17 MED ORDER — NITROGLYCERIN 0.4 MG SL SUBL: 0.4000 mg | SUBLINGUAL_TABLET | SUBLINGUAL | Status: DC | PRN

## 2018-03-17 MED ORDER — TICAGRELOR 90 MG PO TABS
ORAL_TABLET | ORAL | Status: DC | PRN
  Administered 2018-03-17: 180 mg via ORAL

## 2018-03-17 MED ORDER — FUROSEMIDE 10 MG/ML IJ SOLN
INTRAMUSCULAR | Status: AC
  Filled 2018-03-17: qty 4

## 2018-03-17 MED ORDER — TICAGRELOR 90 MG PO TABS
90.0000 mg | ORAL_TABLET | Freq: Two times a day (BID) | ORAL | Status: DC
  Administered 2018-03-18 – 2018-03-19 (×3): 90 mg via ORAL
  Filled 2018-03-17 (×3): qty 1

## 2018-03-17 MED ORDER — ASPIRIN EC 81 MG PO TBEC: 81.0000 mg | DELAYED_RELEASE_TABLET | Freq: Every day | ORAL | Status: DC

## 2018-03-17 MED ORDER — HEPARIN SODIUM (PORCINE) 5000 UNIT/ML IJ SOLN
4000.0000 [IU] | Freq: Once | INTRAMUSCULAR | Status: AC
  Administered 2018-03-17: 4000 [IU] via INTRAVENOUS

## 2018-03-17 MED ORDER — VERAPAMIL HCL 2.5 MG/ML IV SOLN
INTRAVENOUS | Status: AC
  Filled 2018-03-17: qty 2

## 2018-03-17 MED ORDER — ASPIRIN 81 MG PO CHEW
324.0000 mg | CHEWABLE_TABLET | Freq: Once | ORAL | Status: AC
  Administered 2018-03-17: 324 mg via ORAL
  Filled 2018-03-17: qty 4

## 2018-03-17 MED ORDER — HEPARIN SODIUM (PORCINE) 1000 UNIT/ML IJ SOLN
INTRAMUSCULAR | Status: AC
  Filled 2018-03-17: qty 1

## 2018-03-17 MED ORDER — HEPARIN SODIUM (PORCINE) 1000 UNIT/ML IJ SOLN
INTRAMUSCULAR | Status: DC | PRN
  Administered 2018-03-17: 7000 [IU] via INTRAVENOUS

## 2018-03-17 MED ORDER — ATROPINE SULFATE 1 MG/10ML IJ SOSY
PREFILLED_SYRINGE | INTRAMUSCULAR | Status: DC | PRN
  Administered 2018-03-17 (×2): 0.5 mg via INTRAVENOUS

## 2018-03-17 MED ORDER — MIDAZOLAM HCL 2 MG/2ML IJ SOLN
INTRAMUSCULAR | Status: DC | PRN
  Administered 2018-03-17: 2 mg via INTRAVENOUS

## 2018-03-17 MED ORDER — HEPARIN (PORCINE) IN NACL 1000-0.9 UT/500ML-% IV SOLN
INTRAVENOUS | Status: AC
  Filled 2018-03-17: qty 1000

## 2018-03-17 MED ORDER — HEPARIN (PORCINE) 25000 UT/250ML-% IV SOLN
INTRAVENOUS | Status: AC
  Filled 2018-03-17: qty 250

## 2018-03-17 MED ORDER — IOHEXOL 350 MG/ML SOLN
INTRAVENOUS | Status: DC | PRN
  Administered 2018-03-17: 135 mL via INTRACARDIAC

## 2018-03-17 MED ORDER — ATORVASTATIN CALCIUM 80 MG PO TABS: 80.0000 mg | ORAL_TABLET | Freq: Every day | ORAL | Status: DC

## 2018-03-17 MED ORDER — CANGRELOR TETRASODIUM 50 MG IV SOLR
INTRAVENOUS | Status: AC
  Filled 2018-03-17: qty 50

## 2018-03-17 SURGICAL SUPPLY — 18 items
BALLN SAPPHIRE 2.5X12 (BALLOONS) ×2
BALLN SAPPHIRE ~~LOC~~ 4.0X15 (BALLOONS) ×2 IMPLANT
BALLOON SAPPHIRE 2.5X12 (BALLOONS) ×1 IMPLANT
CATH LAUNCHER 6FR AL.75 (CATHETERS) ×2 IMPLANT
CATH OPTITORQUE TIG 4.0 5F (CATHETERS) ×2 IMPLANT
DEVICE RAD COMP TR BAND LRG (VASCULAR PRODUCTS) ×2 IMPLANT
GLIDESHEATH SLEND A-KIT 6F 22G (SHEATH) ×2 IMPLANT
GUIDEWIRE INQWIRE 1.5J.035X260 (WIRE) ×1 IMPLANT
HOVERMATT SINGLE USE (MISCELLANEOUS) ×2 IMPLANT
INQWIRE 1.5J .035X260CM (WIRE) ×2
KIT ENCORE 26 ADVANTAGE (KITS) ×2 IMPLANT
KIT HEART LEFT (KITS) ×2 IMPLANT
PACK CARDIAC CATHETERIZATION (CUSTOM PROCEDURE TRAY) ×2 IMPLANT
SHEATH PROBE COVER 6X72 (BAG) ×2 IMPLANT
STENT SIERRA 3.50 X 23 MM (Permanent Stent) ×2 IMPLANT
TRANSDUCER W/STOPCOCK (MISCELLANEOUS) ×2 IMPLANT
TUBING CIL FLEX 10 FLL-RA (TUBING) ×2 IMPLANT
WIRE ASAHI PROWATER 180CM (WIRE) ×2 IMPLANT

## 2018-03-17 NOTE — ED Notes (Signed)
Beeped through [facility] to [phone].Code STEMI
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
## 2018-03-17 NOTE — ED Provider Notes (Signed)
[facility] EMERGENCY DEPARTMENT Provider Note   CSN: [id number] Arrival date & time: 03/17/18  1517     History   Chief Complaint Chief Complaint  Patient presents with  . Code STEMI    HPI Eddie Lewis is a 57 y.o. male.  Patient complains of severe chest pain since 3 PM today.  Patient has a history of an MI and has 3 stents.  He also states that he had some moderate discomfort since 3 AM today.  Patient has not been taking any of his medicines or his aspirin or anything.  Although he did take some old nitro today with little relief  The history is provided by the patient. No language interpreter was used.  Chest Pain   This is a new problem. The current episode started less than 1 hour ago. The problem occurs constantly. The problem has been gradually improving. The pain is associated with exertion. The pain is present in the substernal region. The pain is at a severity of 6/10. The pain is moderate. The quality of the pain is described as heavy. The pain does not radiate. Duration of episode(s) is 30 minutes. The symptoms are aggravated by exertion. Pertinent negatives include no abdominal pain, no back pain, no cough and no headaches.  Pertinent negatives for past medical history include no seizures.    Past Medical History:  Diagnosis Date  . Kidney stones 2006  . Myocardial infarction [facility])     Patient Active Problem List   Diagnosis Date Noted  . Chest pain 03/04/2014  . Unstable angina (HCC) 03/04/2014    Past Surgical History:  Procedure Laterality Date  . CARDIAC CATHETERIZATION    . CORONARY STENT PLACEMENT  11/28/11   2nd stent on 12/03/11  . LEFT AND RIGHT HEART CATHETERIZATION WITH CORONARY ANGIOGRAM  11/29/2011   Procedure: LEFT AND RIGHT HEART CATHETERIZATION WITH CORONARY ANGIOGRAM;  Surgeon: Robynn Pane, MD;  Location: [facility] CATH LAB;  Service: Cardiovascular;;  . LEFT HEART CATHETERIZATION WITH CORONARY ANGIOGRAM N/A 12/03/2011   Procedure: LEFT  HEART CATHETERIZATION WITH CORONARY ANGIOGRAM;  Surgeon: Robynn Pane, MD;  Location: [facility] CATH LAB;  Service: Cardiovascular;  Laterality: N/A;  . NO PAST SURGERIES    . PERCUTANEOUS CORONARY STENT INTERVENTION (PCI-S)  11/29/2011   Procedure: PERCUTANEOUS CORONARY STENT INTERVENTION (PCI-S);  Surgeon: Robynn Pane, MD;  Location: [facility] CATH LAB;  Service: Cardiovascular;;        Home Medications    Prior to Admission medications   Medication Sig Start Date End Date Taking? Authorizing Provider  aspirin EC 81 MG tablet Take 81 mg by mouth daily.    [provider]  atorvastatin (LIPITOR) 80 MG tablet Take 1 tablet (80 mg total) by mouth daily with breakfast. 03/06/14   Rinaldo Cloud, MD  BRILINTA 60 MG TABS tablet Take 60 mg by mouth 2 (two) times daily. 07/25/14   [provider]  metoprolol succinate (TOPROL-XL) 25 MG 24 hr tablet Take 25 mg by mouth daily.    [provider]  nitroGLYCERIN (NITROSTAT) 0.4 MG SL tablet Place 1 tablet (0.4 mg total) under the tongue every 5 (five) minutes x 3 doses as needed for chest pain. 03/06/14 06/04/16  Rinaldo Cloud, MD  polyethylene glycol powder (GLYCOLAX/MIRALAX) powder Take 17 g by mouth daily. 10/01/14   Palumbo, April, MD  ramipril (ALTACE) 2.5 MG capsule Take 2.5 mg by mouth daily.    [provider]    Family History Family  History  Problem Relation Age of Onset  . Other Father     Social History Social History   Tobacco Use  . Smoking status: Current Every Day Smoker    Packs/day: 1.00    Years: 33.00    Pack years: 33.00    Last attempt to quit: 11/27/2011    Years since quitting: 6.3  . Smokeless tobacco: Never Used  Substance Use Topics  . Alcohol use: Yes  . Drug use: No     Allergies   Codeine   Review of Systems Review of Systems  Constitutional: Negative for appetite change and fatigue.  HENT: Negative for congestion, ear discharge and sinus pressure.   Eyes: Negative for  discharge.  Respiratory: Negative for cough.   Cardiovascular: Positive for chest pain.  Gastrointestinal: Negative for abdominal pain and diarrhea.  Genitourinary: Negative for frequency and hematuria.  Musculoskeletal: Negative for back pain.  Skin: Negative for rash.  Neurological: Negative for seizures and headaches.  Psychiatric/Behavioral: Negative for hallucinations.     Physical Exam Updated Vital Signs There were no vitals taken for this visit.  Physical Exam Constitutional:      Appearance: He is well-developed.  HENT:     Head: Normocephalic.     Nose: Nose normal.     Mouth/Throat:     Mouth: Mucous membranes are moist.  Eyes:     General: No scleral icterus.    Conjunctiva/sclera: Conjunctivae normal.  Neck:     Musculoskeletal: Neck supple.     Thyroid: No thyromegaly.  Cardiovascular:     Rate and Rhythm: Normal rate and regular rhythm.     Heart sounds: No murmur. No friction rub. No gallop.   Pulmonary:     Breath sounds: No stridor. No wheezing or rales.  Chest:     Chest wall: No tenderness.  Abdominal:     General: There is no distension.     Tenderness: There is no abdominal tenderness. There is no rebound.  Musculoskeletal: Normal range of motion.  Lymphadenopathy:     Cervical: No cervical adenopathy.  Skin:    Findings: No erythema or rash.  Neurological:     Mental Status: He is oriented to person, place, and time.     Motor: No abnormal muscle tone.     Coordination: Coordination normal.  Psychiatric:        Behavior: Behavior normal.      ED Treatments / Results  Labs (all labs ordered are listed, but only abnormal results are displayed) Labs Reviewed  I-STAT CHEM 8, ED - Abnormal; Notable for the following components:      Result Value   Glucose, Bld 171 (*)    Calcium, Ion 1.14 (*)    Hemoglobin 17.3 (*)    All other components within normal limits  CBC WITH DIFFERENTIAL/PLATELET  PROTIME-INR  APTT  COMPREHENSIVE  METABOLIC PANEL  TROPONIN I  LIPID PANEL    EKG EKG Interpretation  Date/Time:  Tuesday March 17 2018 15:24:16 EST Ventricular Rate:  101 PR Interval:  196 QRS Duration: 94 QT Interval:  332 QTC Calculation: 430 R Axis:   -51 Text Interpretation:   Critical Test Result: STEMI Undetermined rhythm Incomplete right bundle branch block Left anterior fascicular block ST elevation consider inferior injury or acute infarctACUTE MI / STEMI ** ** Consider right ventricular involvement in acute inferior infarct Abnormal ECG Confirmed by Bethann BerkshireZammit, Tosha Belgarde [id number]([id number]) on 03/17/2018 3:28:42 PM   Radiology No results found.  Procedures Procedures (including  critical care time)  Medications Ordered in ED Medications  0.9 %  sodium chloride infusion (has no administration in time range)  aspirin chewable tablet 324 mg (has no administration in time range)  heparin injection 60 Units/kg (has no administration in time range)     Initial Impression / Assessment and Plan / ED Course  I have reviewed the triage vital signs and the nursing notes.  Pertinent labs & imaging results that were available during my care of the patient were reviewed by me and considered in my medical decision making (see chart for details). CRITICAL CARE Performed by: Bethann Berkshire Total critical care time: 35 minutes Critical care time was exclusive of separately billable procedures and treating other patients. Critical care was necessary to treat or prevent imminent or life-threatening deterioration. Critical care was time spent personally by me on the following activities: development of treatment plan with patient and/or surrogate as well as nursing, discussions with consultants, evaluation of patient's response to treatment, examination of patient, obtaining history from patient or surrogate, ordering and performing treatments and interventions, ordering and review of laboratory studies, ordering and review of  radiographic studies, pulse oximetry and re-evaluation of patient's condition.     EKG shows inferior STEMI.  Cardiology was called and the patient will be taken directly to the Cath Lab  Final Clinical Impressions(s) / ED Diagnoses   Final diagnoses:  None    ED Discharge Orders    None       Bethann Berkshire, MD 03/17/18 [phone]

## 2018-03-17 NOTE — H&P (Signed)
Cardiology Admission History and Physical:   Patient ID: Eddie Lewis MRN: [id number]; DOB: September 14, 1960   Admission date: 03/17/2018  Primary Care Provider: System, Pcp Not In Primary Cardiologist: Rinaldo Cloud, MD Primary Electrophysiologist:  None   Chief Complaint: Chest Pain/ STEMI  Patient Profile:   Eddie Lewis is a 57 y.o. male with a history of CAD with STEMI in 2013 with stenting of LAD and RCA, hypertension, hyperlipidemia, morbid obesity, and tobacco abuse who presented to [facility] ED for evaluation of chest pain and was found to have an inferior STEMI. Patient transferred to [facility] for emergent cardiac catheterization.   History of Present Illness:   Eddie Lewis is a 57 y.o. male with a history of CAD with STEMI in 2013 s/p  stenting to LAD and RCA, hypertension, hyperlipidemia, morbid obesity, and tobacco abuse who presented to [facility] ED for evaluation of chest pain.  Patient reports onset of substernal chest pain around 3pm. Patient describes initial pain as a "pressure" and states it felt like indigestion. However, around 3:30pm, patient reports pain became sharp and felt like he was "getting kicked in the chest by a horse" with associated shortness of breath, nausea, and lightheadedness. Patient took Nitroglycerin with no improvement. Pain improved some by the time patient got to the [facility] ED. Patient was found to have an inferior STEMI. He received Aspirin 324mg  and was started on IV Heparin in the ED. He was then transferred to [facility] for an emergent cardiac catheterization.  Patient reports he has not taken any of his medications which include Aspirin, Lipitor, Lopressor, and Ramipril in the last 14 months.   Past Medical History:  Diagnosis Date  . Kidney stones 2006  . Myocardial infarction [facility])     Past Surgical History:  Procedure Laterality Date  . CARDIAC CATHETERIZATION    . CORONARY STENT PLACEMENT  11/28/11   2nd stent on  12/03/11  . LEFT AND RIGHT HEART CATHETERIZATION WITH CORONARY ANGIOGRAM  11/29/2011   Procedure: LEFT AND RIGHT HEART CATHETERIZATION WITH CORONARY ANGIOGRAM;  Surgeon: Robynn Pane, MD;  Location: [facility] CATH LAB;  Service: Cardiovascular;;  . LEFT HEART CATHETERIZATION WITH CORONARY ANGIOGRAM N/A 12/03/2011   Procedure: LEFT HEART CATHETERIZATION WITH CORONARY ANGIOGRAM;  Surgeon: Robynn Pane, MD;  Location: [facility] CATH LAB;  Service: Cardiovascular;  Laterality: N/A;  . NO PAST SURGERIES    . PERCUTANEOUS CORONARY STENT INTERVENTION (PCI-S)  11/29/2011   Procedure: PERCUTANEOUS CORONARY STENT INTERVENTION (PCI-S);  Surgeon: Robynn Pane, MD;  Location: [facility] CATH LAB;  Service: Cardiovascular;;     Medications Prior to Admission: Prior to Admission medications   Medication Sig Start Date End Date Taking? Authorizing Provider  aspirin EC 81 MG tablet Take 81 mg by mouth daily.    [provider]  atorvastatin (LIPITOR) 80 MG tablet Take 1 tablet (80 mg total) by mouth daily with breakfast. 03/06/14   Rinaldo Cloud, MD  BRILINTA 60 MG TABS tablet Take 60 mg by mouth 2 (two) times daily. 07/25/14   [provider]  metoprolol succinate (TOPROL-XL) 25 MG 24 hr tablet Take 25 mg by mouth daily.    [provider]  nitroGLYCERIN (NITROSTAT) 0.4 MG SL tablet Place 1 tablet (0.4 mg total) under the tongue every 5 (five) minutes x 3 doses as needed for chest pain. 03/06/14 06/04/16  Rinaldo Cloud, MD  polyethylene glycol powder (GLYCOLAX/MIRALAX) powder Take 17 g by mouth daily. 10/01/14  Palumbo, April, MD  ramipril (ALTACE) 2.5 MG capsule Take 2.5 mg by mouth daily.    [provider]     Allergies:    Allergies  Allergen Reactions  . Codeine Nausea And Vomiting    Social History:   Social History   Socioeconomic History  . Marital status: Married    Spouse name: Not on file  . Number of children: Not on file  . Years of education: Not on file  . Highest  education level: Not on file  Occupational History  . Not on file  Social Needs  . Financial resource strain: Not on file  . Food insecurity:    Worry: Not on file    Inability: Not on file  . Transportation needs:    Medical: Not on file    Non-medical: Not on file  Tobacco Use  . Smoking status: Current Every Day Smoker    Packs/day: 1.00    Years: 33.00    Pack years: 33.00    Last attempt to quit: 11/27/2011    Years since quitting: 6.3  . Smokeless tobacco: Never Used  Substance and Sexual Activity  . Alcohol use: Yes  . Drug use: No  . Sexual activity: Yes  Lifestyle  . Physical activity:    Days per week: Not on file    Minutes per session: Not on file  . Stress: Not on file  Relationships  . Social connections:    Talks on phone: Not on file    Gets together: Not on file    Attends religious service: Not on file    Active member of club or organization: Not on file    Attends meetings of clubs or organizations: Not on file    Relationship status: Not on file  . Intimate partner violence:    Fear of current or ex partner: Not on file    Emotionally abused: Not on file    Physically abused: Not on file    Forced sexual activity: Not on file  Other Topics Concern  . Not on file  Social History Narrative  . Not on file    Family History:   The patient's family history includes Other in his father.   Unable to gather more family history due to need for emergent cardiac catheterization.  ROS:  Please see the history of present illness.  ROS limited due to need for emergent cardiac catheterization.   Physical Exam/Data:   Vitals:   03/17/18 1530 03/17/18 1545 03/17/18 1547  BP: (!) 136/93 (!) 122/91   Pulse: 89 (!) 101   Resp: 16 (!) 21   Temp:  98.6 F (37 C)   TempSrc:  Oral   SpO2: 93% 95%   Weight:   90.7 kg  Height:   5\' 3"  (1.6 m)   No intake or output data in the 24 hours ending 03/17/18 1603 Filed Weights   03/17/18 1547  Weight: 90.7 kg     Body mass index is 35.43 kg/m.  General:  Well nourished, well developed, in no acute distress. HEENT: Normal. Lymph: No adenopathy. Neck: Supple. Difficult to assess JVD due to body habitus. Endocrine:  No thyromegaly. Vascular: Radial and distal pedal pulses 2+ and equal bilaterally. Cardiac:  RRR. Distinct S1 and S2. No significant murmurs, gallops, or rubs.  Lungs: Clear to auscultation anteriorly. Abd: Soft, obese, non-tender to palpation. Bowel sounds present.  Ext: No lower extremity edema. Musculoskeletal:  No deformities. BUE and BLE strength  normal and equal. Skin: Warm and dry. Neuro:  No focal deficits.  Psych:  Normal affect.   EKG:  The ECG that was done was personally reviewed and demonstrates inferior STEMI.  Relevant CV Studies:  Left Heart Catheterization 11/30/2011: Findings: LV showed mild anterolateral wall hypokinesis in the visceral region.  EF of approximately 45-50%.  Left main was patent.  LAD was 100% occluded beyond proximal portion with TIMI 0 flow.  Left circumflex was large, which was patent, which tapers down in AV groove after giving off large OM 2.  OM 1 was small, which was patent.  OM 2 was large, which was patent.  RCA has 30% ostial and 20-30% proximal and mid- stenosis, and 75-80% distal stenosis with thrombus just prior to the bifurcation with PDA.  PDA and PLV branches were patent with TIMI grade 3 distal flow.  Interventional Procedure: Successful PTCA to proximal LAD was done using 2.5 x 12-mm long Emerge balloon for predilatation and then 3.5 x 23-mm long Xience Xpedition drug-eluting stent was deployed at 10 atmospheric pressure.  The stent was postdilated using 3.5 x 15-mm long Brashear Trek balloon going up to 15 atmospheric pressure.  Lesion was dilated from 100% to 0% residual with excellent TIMI grade 3 distal flow without evidence of dissection or distal embolization.  The patient received weight-based Angiomax, 180 mg of  Brilinta and weight-based Integrilin during the procedure.  Again at the end of the procedure, angiogram of right coronary artery was done, which showed persistent haziness and thrombus.  The patient received 10 mg of IV t-PA slowly through the guiding catheter with partial resolution of the thrombus.  The patient remained chest pain free and had TIMI grade 3 distal flow in RCA and LAD.  The patient tolerated the procedure well.  There were no complications.  Plan is to continue with Integrilin IV for 36 hours.  We will bring him back for relook angiogram early next week. _______________  Left Heart Catheterization 12/04/2011: Findings: LAD was patent at prior PTCA and stented site with excellent TIMI grade 3 distal flow.  RCA has distal 70% stenosis with ulcerated plaque and haziness with TIMI grade 3 distal flow.  Interventional Procedure:  Successful PTCA to distal RCA was done using 2.5 x 8 mm long Emerge balloon for predilatation and then 3.5 x 12 mm long Xience drug-eluting stent was deployed in distal RCA at 10 atmospheric pressure.  The stent was postdilated using the same balloon going up to 15 atmospheric pressure and lesion dilated from 70% with ulcerated plaque and haziness to 0% ostial with excellent TIMI grade 3 distal flow without evidence of dissection or distal embolization.  The patient received weight-based Angiomax and total of 180 of Brilinta prior to the procedure.  The patient tolerated the procedure well.  There were no complications.  The patient was transferred to recovery room in stable condition.  Laboratory Data:  Chemistry Recent Labs  Lab 03/17/18 1535  NA 138  K 3.7  CL 98  GLUCOSE 171*  BUN 15  CREATININE 1.00    No results for input(s): PROT, ALBUMIN, AST, ALT, ALKPHOS, BILITOT in the last 168 hours. Hematology Recent Labs  Lab 03/17/18 1535  WBC 10.9*  RBC 5.66  HGB 16.2  17.3*  HCT 51.3  51.0  MCV 90.6  MCH 28.6  MCHC 31.6  RDW  12.4  PLT 226   Cardiac EnzymesNo results for input(s): TROPONINI in the last 168 hours.  Recent Labs  Lab 03/17/18 1533  TROPIPOC 0.11*    BNPNo results for input(s): BNP, PROBNP in the last 168 hours.  DDimer No results for input(s): DDIMER in the last 168 hours.  Radiology/Studies:  Dg Chest Port 1 View  Result Date: 03/17/2018 CLINICAL DATA:  Left-sided chest pain and shortness of breath for several hours EXAM: PORTABLE CHEST 1 VIEW COMPARISON:  09/30/2014 FINDINGS: The heart size and mediastinal contours are within normal limits. Both lungs are clear. The visualized skeletal structures are unremarkable. IMPRESSION: No active disease. Electronically Signed   By: Alcide Clever M.D.   On: 03/17/2018 15:56    Assessment and Plan:   Inferior STEMI - Patient presented to [facility] ED for evaluation of chest pain and found have inferior STEMI.  - Per chart review, patient has a history of STEMI in 2013 s/p PCI with stenting to LAD and RCA.  - Troponin elevated at 0.11.  - Lactic acid elevated at 2.23. - Patient received Aspirin 324mg  and was started on IV Heparin in the ED. - Patient transferred to [facility] and taken immediately to cath lab for emergent cardiac catheterization.   Severity of Illness: The appropriate patient status for this patient is INPATIENT. Inpatient status is judged to be reasonable and necessary in order to provide the required intensity of service to ensure the patient's safety. The patient's presenting symptoms, physical exam findings, and initial radiographic and laboratory data in the context of their chronic comorbidities is felt to place them at high risk for further clinical deterioration. Furthermore, it is not anticipated that the patient will be medically stable for discharge from the hospital within 2 midnights of admission. The following factors support the patient status of inpatient.   " The patient's presenting symptoms include chest pain. " The  physical exam findings are unremarkable. " The initial radiographic and laboratory data are worrisome because of STEMI. " The chronic co-morbidities include hypertension, hyperlipidemia.   * I certify that at the point of admission it is my clinical judgment that the patient will require inpatient hospital care spanning beyond 2 midnights from the point of admission due to high intensity of service, high risk for further deterioration and high frequency of surveillance required.*    For questions or updates, please contact [facility] Please consult [email or web address] for contact info under        Signed, Corrin Parker, PA-C  03/17/2018 4:03 PM

## 2018-03-17 NOTE — ED Notes (Signed)
CRITICAL VALUE ALERT  Critical Value:  Troponin 0.11 Lactic 2.23  Date & Time Notied:  03/17/18 1600  Provider Notified: Dr. Estell HarpinZammit   Orders Received/Actions taken: Pt to Cath Lab

## 2018-03-17 NOTE — ED Notes (Addendum)
Troponin 0.11 

## 2018-03-17 NOTE — ED Notes (Signed)
Pt now leaving with EMS. Going straight to cath lab

## 2018-03-17 NOTE — ED Triage Notes (Signed)
Pt c/o central and left sided chest pain with SOB since 4 am this morning.

## 2018-03-18 ENCOUNTER — Inpatient Hospital Stay (HOSPITAL_COMMUNITY): Payer: BLUE CROSS/BLUE SHIELD

## 2018-03-18 ENCOUNTER — Encounter (HOSPITAL_COMMUNITY): Payer: Self-pay | Admitting: Cardiology

## 2018-03-18 DIAGNOSIS — I361 Nonrheumatic tricuspid (valve) insufficiency: Secondary | ICD-10-CM

## 2018-03-18 DIAGNOSIS — I213 ST elevation (STEMI) myocardial infarction of unspecified site: Secondary | ICD-10-CM | POA: Diagnosis present

## 2018-03-18 LAB — LIPID PANEL
Cholesterol: 177 mg/dL (ref 0–200)
HDL: 29 mg/dL — ABNORMAL LOW (ref >40)
LDL Cholesterol: 117 mg/dL — ABNORMAL HIGH (ref 0–99)
Total CHOL/HDL Ratio: 6.1 RATIO
Triglycerides: 154 mg/dL — ABNORMAL HIGH (ref <150)
VLDL: 31 mg/dL (ref 0–40)

## 2018-03-18 LAB — CBC
HCT: 47.4 % (ref 39.0–52.0)
HCT: 49.2 % (ref 39.0–52.0)
Hemoglobin: 15.2 g/dL (ref 13.0–17.0)
Hemoglobin: 15.8 g/dL (ref 13.0–17.0)
MCH: 28.5 pg (ref 26.0–34.0)
MCH: 28.6 pg (ref 26.0–34.0)
MCHC: 32.1 g/dL (ref 30.0–36.0)
MCHC: 32.1 g/dL (ref 30.0–36.0)
MCV: 88.8 fL (ref 80.0–100.0)
MCV: 89.1 fL (ref 80.0–100.0)
PLATELETS: 200 10*3/uL (ref 150–400)
PLATELETS: 194 10*3/uL (ref 150–400)
RBC: 5.34 MIL/uL (ref 4.22–5.81)
RBC: 5.52 MIL/uL (ref 4.22–5.81)
RDW: 12.5 % (ref 11.5–15.5)
RDW: 12.4 % (ref 11.5–15.5)
WBC: 12.1 10*3/uL — ABNORMAL HIGH (ref 4.0–10.5)
WBC: 10.2 10*3/uL (ref 4.0–10.5)
nRBC: 0 % (ref 0.0–0.2)
nRBC: 0 % (ref 0.0–0.2)

## 2018-03-18 LAB — HEMOGLOBIN A1C
Hgb A1c MFr Bld: 6.2 % — ABNORMAL HIGH (ref 4.8–5.6)
Mean Plasma Glucose: 131.24 mg/dL

## 2018-03-18 LAB — BASIC METABOLIC PANEL
Anion gap: 8 (ref 5–15)
BUN: 13 mg/dL (ref 6–20)
CO2: 29 mmol/L (ref 22–32)
Calcium: 9 mg/dL (ref 8.9–10.3)
Chloride: 98 mmol/L (ref 98–111)
Creatinine, Ser: 0.93 mg/dL (ref 0.61–1.24)
GFR calc Af Amer: >60 mL/min (ref >60)
Glucose, Bld: 166 mg/dL — ABNORMAL HIGH (ref 70–99)
Potassium: 3.7 mmol/L (ref 3.5–5.1)
Sodium: 135 mmol/L (ref 135–145)

## 2018-03-18 LAB — CREATININE, SERUM
Creatinine, Ser: 1 mg/dL (ref 0.61–1.24)
GFR calc Af Amer: >60 mL/min (ref >60)
GFR calc non Af Amer: >60 mL/min (ref >60)

## 2018-03-18 LAB — ECHOCARDIOGRAM COMPLETE
Height: 63 in
WEIGHTICAEL: 3200 [oz_av]

## 2018-03-18 LAB — TROPONIN I
Troponin I: 9.38 ng/mL (ref <0.03)
Troponin I: 7.69 ng/mL (ref <0.03)

## 2018-03-18 MED ORDER — PERFLUTREN LIPID MICROSPHERE
1.0000 mL | INTRAVENOUS | Status: AC | PRN
  Administered 2018-03-18: 2 mL via INTRAVENOUS
  Filled 2018-03-18: qty 10

## 2018-03-18 MED ORDER — LOSARTAN POTASSIUM 50 MG PO TABS
50.0000 mg | ORAL_TABLET | Freq: Every day | ORAL | Status: DC
  Administered 2018-03-18 – 2018-03-19 (×2): 50 mg via ORAL
  Filled 2018-03-18 (×2): qty 1

## 2018-03-18 MED ORDER — PERFLUTREN LIPID MICROSPHERE
INTRAVENOUS | Status: AC
  Filled 2018-03-18: qty 10

## 2018-03-18 MED ORDER — ENOXAPARIN SODIUM 40 MG/0.4ML ~~LOC~~ SOLN
40.0000 mg | SUBCUTANEOUS | Status: DC
  Administered 2018-03-18: 40 mg via SUBCUTANEOUS
  Filled 2018-03-18: qty 0.4

## 2018-03-18 MED FILL — Morphine Sulfate Inj 10 MG/ML: INTRAMUSCULAR | Qty: 0.2 | Status: AC

## 2018-03-18 MED FILL — Atropine Sulfate Soln Prefill Syr 1 MG/10ML (0.1 MG/ML): INTRAMUSCULAR | Qty: 10 | Status: AC

## 2018-03-18 NOTE — Progress Notes (Signed)
CRITICAL VALUE ALERT  Critical Value:  Trop  Date & Time Notified:  12/18 @1845   Provider Notified: NP Julien GirtMcDaniel  Orders Received/Actions taken: No new orders at this time.

## 2018-03-18 NOTE — Progress Notes (Signed)
  Echocardiogram 2D Echocardiogram has been performed.  Eddie Lewis, Eddie Lewis 03/18/2018, 3:20 PM

## 2018-03-18 NOTE — Care Management (Signed)
Brilinta benefits check sent and pending. CM team will follow to discuss est monthly copay amount and provide Brilinta card once cost has been determined.   Shalita Notte Y. Joelee Snoke BSN, NCM-BC, ACM-RN  

## 2018-03-18 NOTE — Progress Notes (Signed)
Subjective:  Appreciate [facility] help.  Patient denies any further chest pain.  Patient noncompliant to medication and follow-up.  Objective:  Vital Signs in the last 24 hours: Temp:  [98.1 F (36.7 C)-98.6 F (37 C)] 98.2 F (36.8 C) (12/18 1156) Pulse Rate:  [0-168] 83 (12/18 0945) Resp:  [0-37] 22 (12/18 0900) BP: (85-146)/(56-107) 131/67 (12/18 0945) SpO2:  [0 %-98 %] 96 % (12/18 0900) Weight:  [90.7 kg] 90.7 kg (12/17 1547)  Intake/Output from previous day: 12/17 0701 - 12/18 0700 In: 252.2 [I.V.:252.2] Out: 875 [Urine:875] Intake/Output from this shift: No intake/output data recorded.  Physical Exam: Neck: no adenopathy, no carotid bruit, no JVD and supple, symmetrical, trachea midline Lungs: clear to auscultation bilaterally Heart: regular rate and rhythm, S1, S2 normal and Soft systolic murmur noted Abdomen: soft, non-tender; bowel sounds normal; no masses,  no organomegaly Extremities: extremities normal, atraumatic, no cyanosis or edema  Lab Results: Recent Labs    03/17/18 1535 03/18/18 0455  WBC 10.9* 12.1*  HGB 16.2  17.3* 15.2  PLT 226 200   Recent Labs    03/17/18 1535 03/18/18 0455  NA 139  138 135  K 3.6  3.7 3.7  CL 101  98 98  CO2 26 29  GLUCOSE 176*  171* 166*  BUN 14  15 13   CREATININE 0.89  1.00 0.93   Recent Labs    03/17/18 1535  TROPONINI 0.12*   Hepatic Function Panel Recent Labs    03/17/18 1535  PROT 7.6  ALBUMIN 4.3  AST 27  ALT 31  ALKPHOS 49  BILITOT 0.6   Recent Labs    03/18/18 0455  CHOL 177   No results for input(s): PROTIME in the last 72 hours.  Imaging: Imaging results have been reviewed and Dg Chest Port 1 View  Result Date: 03/17/2018 CLINICAL DATA:  Left-sided chest pain and shortness of breath for several hours EXAM: PORTABLE CHEST 1 VIEW COMPARISON:  09/30/2014 FINDINGS: The heart size and mediastinal contours are within normal limits. Both lungs are clear. The visualized skeletal structures are  unremarkable. IMPRESSION: No active disease. Electronically Signed   By: Alcide CleverMark  Lukens M.D.   On: 03/17/2018 15:56    Cardiac Studies:  Assessment/Plan:  Acute inferior wall MI status post PCI 200% occluded RCA History of anteroseptal wall myocardial infarction in 2013 status post PCI 100% occluded LAD and staged PCI to distal RCA in 2013 Hypertension Hyperlipidemia Tobacco abuse Prediabetes Plan Check serial enzymes and EKG Start losartan 50 mg daily Check 2D echo Discussed with patient regarding lifestyle changes compliance with medication diet etc. Transfer to telemetry Phase 1 cardiac rehab  LOS: 1 day    Rinaldo CloudHarwani, Sergi Gellner 03/18/2018, 11:59 AM

## 2018-03-18 NOTE — Care Management (Signed)
1.  S/W  JEANNIE  @ [employer] # [phone]   TICAGRELOR : NONE FORMULARY   BRILINTA  90 MG BID COVER- YES CO-PAY- $ 50.00 TIER- 3 DRUG PRIOR APPROVAL- NO  NO DEDUCTIBLE  PREFERRED PHARMACY : YES [facility], [facility], [facility]  AND [facility]

## 2018-03-19 LAB — CBC
HEMATOCRIT: 46.9 % (ref 39.0–52.0)
Hemoglobin: 15.6 g/dL (ref 13.0–17.0)
MCH: 29.6 pg (ref 26.0–34.0)
MCHC: 33.3 g/dL (ref 30.0–36.0)
MCV: 89 fL (ref 80.0–100.0)
Platelets: 200 10*3/uL (ref 150–400)
RBC: 5.27 MIL/uL (ref 4.22–5.81)
RDW: 12.5 % (ref 11.5–15.5)
WBC: 10.2 10*3/uL (ref 4.0–10.5)
nRBC: 0 % (ref 0.0–0.2)

## 2018-03-19 LAB — BASIC METABOLIC PANEL
Anion gap: 11 (ref 5–15)
BUN: 13 mg/dL (ref 6–20)
CO2: 26 mmol/L (ref 22–32)
Calcium: 9 mg/dL (ref 8.9–10.3)
Chloride: 101 mmol/L (ref 98–111)
Creatinine, Ser: 0.89 mg/dL (ref 0.61–1.24)
GFR calc Af Amer: >60 mL/min (ref >60)
GFR calc non Af Amer: >60 mL/min (ref >60)
Glucose, Bld: 123 mg/dL — ABNORMAL HIGH (ref 70–99)
Potassium: 3.8 mmol/L (ref 3.5–5.1)
Sodium: 138 mmol/L (ref 135–145)

## 2018-03-19 LAB — TROPONIN I: Troponin I: 7.17 ng/mL (ref <0.03)

## 2018-03-19 MED ORDER — TICAGRELOR 90 MG PO TABS: 90.0000 mg | ORAL_TABLET | Freq: Two times a day (BID) | ORAL | 11 refills | Status: DC

## 2018-03-19 MED ORDER — LOSARTAN POTASSIUM 25 MG PO TABS: 25.0000 mg | ORAL_TABLET | Freq: Every day | ORAL | 3 refills | Status: DC

## 2018-03-19 MED FILL — BRILINTA 90 MG TABLET: 90 | 30 days supply | Qty: 60 | Fill #0 | Status: TO

## 2018-03-19 MED FILL — LOSARTAN POTASSIUM 25 MG TA: 25 | 30 days supply | Qty: 30 | Fill #0 | Status: TO

## 2018-03-19 NOTE — Discharge Summary (Signed)
NAME: Eddie Lewis, Eddie L. MEDICAL RECORD [id number] ACCOUNT [id number] DATE OF BIRTH:07-08-1960 FACILITY: [facility] LOCATION: [facility] PHYSICIAN:Zilah Villaflor Jeoffrey MassedN. Rosy Estabrook, MD  DISCHARGE SUMMARY  DATE OF DISCHARGE:  03/19/2018  ADMITTING DIAGNOSES: 1.  Acute inferior wall myocardial infarction. 2.  History of anteroseptal wall myocardial infarction in 2013, status post percutaneous coronary intervention to 100% occluded right coronary artery and staged percutaneous coronary intervention to distal right coronary artery in 2013. 3.  Hypertension. 4.  Hyperlipidemia. 5.  Tobacco abuse. 6.  Morbid obesity. 7.  Prediabetic.  FINAL DIAGNOSES: 1.  Status post acute inferior wall myocardial infarction, status post percutaneous coronary intervention to 100% occluded mid right coronary artery. 2.  History of anteroseptal wall myocardial infarction in 2013, status post percutaneous coronary intervention to 100% occluded left anterior descending and staged percutaneous coronary intervention to distal right coronary artery in 2013. 3.  Hypertension. 4.  Hyperlipidemia. 5.  Tobacco abuse. 6.  Prediabetic. 7.  Morbid obesity.  DISCHARGE HOME MEDICATIONS: 1.  Losartan 25 mg 1 tablet daily. 2.  Aspirin 81 mg daily. 3.  Atorvastatin 80 mg daily. 4.  Metoprolol succinate 25 mg daily. 5.  Nitrostat 0.4 mg sublingual every 5 minutes as directed. 6.  MiraLax 17 g as needed. 7.  Brilinta 90 mg twice daily.  The Lewis has been advised to stop ibuprofen and ramipril.  DIET:  Low-salt, low-cholesterol 1800-calorie ADA diet.  The Lewis will be scheduled for phase 2 cardiac rehab as outpatient.  The Lewis has been discussed extensively regarding diet compliance with medication, followup, lifestyle changes, and smoking cessation.  CONDITION AT DISCHARGE:  Stable.  FOLLOWUP:  With me in 1 week.  BRIEF HISTORY AND HOSPITAL COURSE:  The Lewis is a 57 year old male with past medical history significant for  coronary artery disease, history of anteroseptal wall MI in 2013, status post PCI to LAD and distal RCA in the past, hypertension,  hyperlipidemia, morbid obesity, tobacco abuse, prediabetic.  He was [facility] and CODE STEMI was called.  The Lewis complained of retrosternal chest pain and epigastric pain associated with nausea and diaphoresis.  Initially  did not seek medical attention, but the pain got worse, so decided to go to the ED.  EKG done in the ED showed acute STEMI.  The [facility].  The Lewis states he had stopped his  medication approximately 14 months ago and has been noncompliant to followup.  PHYSICAL EXAMINATION: GENERAL:  He was alert, awake, oriented x3, in no acute distress, hemodynamically stable. HEENT:  Conjunctivae pink. NECK:  Supple, no JVD, no bruit. LUNGS:  Clear to auscultation without rhonchi or rales. CARDIOVASCULAR:  S1, S2 were normal.  No significant murmur, gallop or rub. ABDOMEN:  Soft.  Bowel sounds are present, nontender. EXTREMITIES:  There is no clubbing, cyanosis or edema. NEUROLOGIC:  Grossly intact.  LABORATORY DATA:  Sodium was 138, potassium 3.6, BUN 14, creatinine 0.89.  Troponin I was 0.11, 0.12.  Repeat troponin I yesterday morning was 9.38; this morning is 7.17, which is trending down.  His blood sugar was 176 today.  Fasting blood sugar is  123.  Potassium today is 3.8, BUN is 13, creatinine 0.89.  Hemoglobin is 15.6, hematocrit 46.9, which has been stable.  His hemoglobin A1c was 6.2.  A 2D echo done yesterday showed mid and basal inferior-posterior hypokinesia, EF of 35% to 40%.  BRIEF HOSPITAL COURSE:  The Lewis was emergently taken to the cath  lab by Dr. Herbie BaltimoreHarding and subsequently underwent PTCA stenting to mid-RCA as per procedure report.  The Lewis tolerated the procedure well.  Postprocedure, the Lewis did not have any  episodes of chest  pain during the hospital stay.  The Lewis has been ambulating in the room without any problems.  His right radial site of intervention is intact with no evidence of hematoma or bruit.  The Lewis was counseled extensively regarding  his lifestyle changes, compliance with medication, followup, smoking cessation, and joining phase 2 rehabilitation to which he agrees.  The Lewis will be discharged home on the above medications and will be followed up in my office in 1 week.  LN/NUANCE D:03/19/2018 T:03/19/2018 JOB:[id number]

## 2018-03-19 NOTE — Discharge Summary (Signed)
Discharge summary dictated on 03/19/2018 dictation number is [id number]

## 2018-03-19 NOTE — Discharge Instructions (Signed)
[id number] coronary Angiogram With Stent Coronary angiogram with stent placement is a procedure to widen or open a narrow blood vessel of the heart (coronary artery). Arteries may become blocked by cholesterol buildup (plaques) in the lining of the wall. When a coronary artery becomes partially blocked, blood flow to that area decreases. This may lead to chest pain or a heart attack (myocardial infarction). A stent is a small piece of metal that looks like mesh or a spring. Stent placement may be done as treatment for a heart attack or right after a coronary angiogram in which a blocked artery is found. Let your health care provider know about:  Any allergies you have.  All medicines you are taking, including vitamins, herbs, eye drops, creams, and over-the-counter medicines.  Any problems you or family members have had with anesthetic medicines.  Any blood disorders you have.  Any surgeries you have had.  Any medical conditions you have.  Whether you are pregnant or may be pregnant. What are the risks? Generally, this is a safe procedure. However, problems may occur, including:  Damage to the heart or its blood vessels.  A return of blockage.  Bleeding, infection, or bruising at the insertion site.  A collection of blood under the skin (hematoma) at the insertion site.  A blood clot in another part of the body.  Kidney injury.  Allergic reaction to the dye or contrast that is used.  Bleeding into the abdomen (retroperitoneal bleeding). What happens before the procedure? Staying hydrated Follow instructions from your health care provider about hydration, which may include:  Up to 2 hours before the procedure - you may continue to drink clear liquids, such as water, clear fruit juice, black coffee, and plain tea.  Eating and drinking restrictions Follow instructions from your health care provider about eating and drinking, which may include:  8 hours before the procedure - stop  eating heavy meals or foods such as meat, fried foods, or fatty foods.  6 hours before the procedure - stop eating light meals or foods, such as toast or cereal.  2 hours before the procedure - stop drinking clear liquids. Ask your health care provider about:  Changing or stopping your regular medicines. This is especially important if you are taking diabetes medicines or blood thinners.  Taking medicines such as ibuprofen. These medicines can thin your blood. Do not take these medicines before your procedure if your health care provider instructs you not to. Generally, aspirin is recommended before a procedure of passing a small, thin tube (catheter) through a blood vessel and into the heart (cardiac catheterization). What happens during the procedure?   An IV tube will be inserted into one of your veins.  You will be given one or more of the following: ? A medicine to help you relax (sedative). ? A medicine to numb the area where the catheter will be inserted into an artery (local anesthetic).  To reduce your risk of infection: ? Your health care team will wash or sanitize their hands. ? Your skin will be washed with soap. ? Hair may be removed from the area where the catheter will be inserted.  Using a guide wire, the catheter will be inserted into an artery. The location may be in your groin, in your wrist, or in the fold of your arm (near your elbow).  A type of X-ray (fluoroscopy) will be used to help guide the catheter to the opening of the arteries in the heart.  A  dye will be injected into the catheter, and X-rays will be taken. The dye will help to show where any narrowing or blockages are located in the arteries.  A tiny wire will be guided to the blocked spot, and a balloon will be inflated to make the artery wider.  The stent will be expanded and will crush the plaques into the wall of the vessel. The stent will hold the area open and improve the blood flow. Most stents  have a drug coating to reduce the risk of the stent narrowing over time.  The artery may be made wider using a drill, laser, or other tools to remove plaques.  When the blood flow is better, the catheter will be removed. The lining of the artery will grow over the stent, which stays where it was placed. This procedure may vary among health care providers and hospitals. What happens after the procedure?  If the procedure is done through the leg, you will be kept in bed lying flat for about 6 hours. You will be instructed to not bend and not cross your legs.  The insertion site will be checked frequently.  The pulse in your foot or wrist will be checked frequently.  You may have additional blood tests, X-rays, and a test that records the electrical activity of your heart (electrocardiogram, or ECG). This information is not intended to replace advice given to you by your health care provider. Make sure you discuss any questions you have with your health care provider. Document Released: 09/22/2002 Document Revised: 06/27/2017 Document Reviewed: 10/22/2015 Elsevier Interactive Patient Education  2019 [employer] Acute Coronary Syndrome  Acute coronary syndrome (ACS) is a serious problem in which there is suddenly not enough blood and oxygen reaching the heart. ACS can result in chest pain or a heart attack. This condition is a medical emergency. If you have any symptoms of this condition, get help right away. What are the causes? This condition may be caused by:  Buildup of fat and cholesterol inside of the arteries (atherosclerosis). This is the most common cause. The buildup (plaque) can cause blood vessels in the heart (coronary arteries) to become narrow or blocked, which reduces blood flow to the heart. Plaque can also break off and lead to a clot, which can block an artery and cause a heart attack or stroke.  Sudden tightening of the muscles around the coronary arteries (coronary  spasm).  Tearing of a coronary artery (spontaneous coronary artery dissection).  Very low blood pressure (hypotension).  An abnormal heartbeat (arrhythmia).  Other medical conditions that cause a decrease of oxygen to the heart, such as anemiaorrespiratory failure.  Using cocaine or methamphetamine. What increases the risk? The following factors may make you more likely to develop this condition:  Age. The risk for ACS increases as you get older.  History of chest pain, heart attack, peripheral artery disease, or stroke.  Having taken chemotherapy or immune-suppressing medicines.  Being male.  Family history of chest pain, heart disease, or stroke.  Smoking.  Not exercising enough.  Being overweight.  High cholesterol.  High blood pressure (hypertension).  Diabetes.  Excessive alcohol use. What are the signs or symptoms? Common symptoms of this condition include:  Chest pain. The pain may last a long time, or it may stop and come back (recur). It may feel like: ? Crushing or squeezing. ? Tightness, pressure, fullness, or heaviness.  Arm, neck, jaw, or back pain.  Heartburn or indigestion.  Shortness of breath.  Nausea.  Sudden cold sweats.  Light-headedness.  Dizziness, or passing out.  Tiredness (fatigue). Sometimes there are no symptoms. How is this diagnosed? This condition may be diagnosed based on:  Your medical history and symptoms.  An electrocardiogram (ECG). This imaging test measures the heart's electrical activity.  Blood tests. Cardiac blood tests may need to be repeated at designated time intervals.  Chest X-ray.  A CT scan of the chest.  A coronary angiogram. This is a procedure in which dye is injected into the bloodstream and then X-rays are taken to show if there is a blockage in a coronary artery.  Exercise stress testing.  Echocardiography. This is a test that uses sound waves to produce detailed images of the heart. How  is this treated? The treatment is to restore blood flow to the heart as soon as possible. Treatment for this condition may include:  Oxygen therapy.  Medicines, such as: ? Antiplatelet medicines and blood-thinning medicines, such as aspirin. These help prevent blood clots. ? Medicine that dissolves any blood clots (fibrinolytic therapy). ? Blood pressure medicines. ? Nitroglycerin. This helps relieve chest pain and widens blood vessels to improve blood flow. ? Pain medicine. ? Cholesterol-lowering medicine.  Surgery, such as: ? Coronary angioplasty with stent placement. This involves placing a small piece of metal that looks like mesh or a spring into a narrow coronary artery. This widens the artery and keep it open. ? Coronary artery bypass surgery. This involves taking a section of a blood vessel from a different part of your body, and placing it on the blocked coronary artery to allow blood to flow around (bypass) the blockage.  Cardiac rehabilitation. This is a program that helps improve your health and well-being. It includes exercise training, education, and counseling to help you recover. Follow these instructions at home: Eating and drinking  Eat a heart-healthy diet that includes whole grains, fruits and vegetables, lean proteins, and low-fat or nonfat dairy products.  Limit how much salt (sodium) you eat as told by your health care provider. Follow instructions from your health care provider about any other eating or drinking restrictions, such as limiting foods that are high in fat and processed sugars.  Use healthy cooking methods such as roasting, grilling, broiling, baking, poaching, steaming, or stir-frying.  Talk with a dietitian to learn about healthy cooking methods and how to eat less sodium. Medicines  Take over-the-counter and prescription medicines only as told by your health care provider.  Do not take these medicines unless your health care provider  approves: ? Vitamin supplements that contain vitamin A or vitamin E. ? Nonsteroidal anti-inflammatory drugs (NSAIDs), such as ibuprofen, naproxen, or celecoxib. ? Hormone replacement therapy that contains estrogen. If you are taking blood thinners:  Talk with your health care provider before you take any medicines that contain aspirin or NSAIDs. These medicines increase your risk for dangerous bleeding.  Take your medicine exactly as told, at the same time every day.  Avoid activities that could cause injury or bruising, and follow instructions about how to prevent falls.  Wear a medical alert bracelet, and carry a card that lists what medicines you take. Activity  Join a cardiac rehabilitation program. An exercise plan will be developed for you.  Ask your health care provider: ? What activities and exercises are safe for you. ? If you should follow specific instructions about lifting, driving, or climbing stairs. Lifestyle  Do not use any products that contain nicotine or tobacco, such as cigarettes  and e-cigarettes. If you need help quitting, ask your health care provider.  If your health care provider says that alcohol is safe for you, limit your alcohol intake to no more than 1 drink a day. One drink equals 12 oz of beer, 5 oz of wine, or 1 oz of hard liquor.  Maintain a healthy weight. If you need to lose weight, work with your health care provider to do so safely. General instructions  Tell all the health care providers who care for you about your heart condition, including your dentist. This may affect the medicines or treatment you receive.  Manage any other health conditions you have, such as hypertension or diabetes. These conditions affect your heart.  Learn ways to manage stress.  Get screened for depression, and get mental health treatment if you need it. People with ACS are at higher risk for depression.  Keep your vaccinations up to date. Get the flu shot  (influenza vaccine) every year.  If directed, monitor your blood pressure at home.  Keep all follow-up visits as told by your health care provider. This is important. Contact a health care provider if:  You feel overwhelmed or sad.  You have trouble doing your daily activities. Get help right away if:  You have pain in your chest, neck, arm, jaw, stomach, or back that recurs, and: ? It lasts for more than a few minutes. ? It is not relieved by taking the medicineyour health care provider prescribed.  You have unexplained: ? Heavy sweating. ? Heartburn or indigestion. ? Nausea or vomiting. ? Shortness of breath. ? Difficulty breathing. ? Fatigue. ? Nervousness or anxiety. ? Weakness. ? Diarrhea. ? Dark stools or blood in your stool.  You have sudden light-headedness or dizziness.  Your blood pressure is higher than 180/120.  You faint.  You have thoughts about hurting yourself. These symptoms may represent a serious problem that is an emergency. Do not wait to see if the symptoms will go away. Get medical help right away. Call your local emergency services (911 in the [place] Do not drive yourself to the hospital. If you ever feel like you may hurt yourself or others, or have thoughts about taking your own life, get help right away. You can go to your nearest emergency department or call:  Emergency services (911 in the [place]  A suicide crisis helpline, such as the National Suicide Prevention Lifeline at 864-506-4544. This is open 24 hours a day. Summary  Acute coronary syndrome (ACS) is when there is not enough blood and oxygen being supplied to the heart. ACS can result in chest pain or a heart attack.  Acute coronary syndrome is a medical emergency. If you have any symptoms of this condition, get help right away.  Treatment includes medicines and procedures to open the blocked arteries and restore blood flow. This information is not intended to replace advice  given to you by your health care provider. Make sure you discuss any questions you have with your health care provider. Document Released: 03/18/2005 Document Revised: 11/26/2016 Document Reviewed: 11/26/2016 Elsevier Interactive Patient Education  2019 [employer].

## 2018-03-19 NOTE — Care Management Note (Signed)
Case Management Note  Patient Details  Name: Eddie Lewis MRN: [id number] Date of Birth: 02/25/1961  Subjective/Objective: 57 yo male presented with CP/STEMI; s/p cath with PCI.                   Action/Plan: CM met with patient to discuss transitional needs. Patient reports living at home with his spouse, employed and independent PTA. Patient confirmed as having no established PCP; patient declined CM arranging an appointment, but agreeable to CM providing information on his AVS; pharmacy of choice: [facility]. Brilinta benefits check complete, with patient informed of est monthly cost $50; Brilinta copay card was provided. Patient agreeable to Rx being filled using [facility]OC pharmacy. Patients spouse will provide transportation home. No further needs from CM.    Expected Discharge Date:                  Expected Discharge Plan:  Home/Self Care  In-House Referral:  NA  Discharge planning Services  CM Consult, Medication Assistance(Brilinta copay card)  Post Acute Care Choice:  NA Choice offered to:  NA  DME Arranged:  N/A DME Agency:  NA  HH Arranged:  NA HH Agency:  NA  Status of Service:  In process, will continue to follow  If discussed at Long Length of Stay Meetings, dates discussed:    Additional Comments:  Natalie Gay RN, BSN, NCM-BC, ACM-RN [phone] 03/19/2018, 8:42 AM  

## 2018-10-21 ENCOUNTER — Emergency Department (HOSPITAL_COMMUNITY)
Admission: EM | Admit: 2018-10-21 | Discharge: 2018-10-22 | Disposition: A | Discharge status: Home / Self Care | Payer: BC Managed Care – PPO | Attending: Emergency Medicine | Admitting: Emergency Medicine

## 2018-10-21 ENCOUNTER — Encounter (HOSPITAL_COMMUNITY): Payer: Self-pay | Admitting: Emergency Medicine

## 2018-10-21 ENCOUNTER — Other Ambulatory Visit: Payer: Self-pay

## 2018-10-21 DIAGNOSIS — I259 Chronic ischemic heart disease, unspecified: Secondary | ICD-10-CM | POA: Diagnosis not present

## 2018-10-21 DIAGNOSIS — I10 Essential (primary) hypertension: Secondary | ICD-10-CM | POA: Insufficient documentation

## 2018-10-21 DIAGNOSIS — L509 Urticaria, unspecified: Secondary | ICD-10-CM | POA: Diagnosis not present

## 2018-10-21 DIAGNOSIS — F17210 Nicotine dependence, cigarettes, uncomplicated: Secondary | ICD-10-CM | POA: Diagnosis not present

## 2018-10-21 DIAGNOSIS — T7840XA Allergy, unspecified, initial encounter: Secondary | ICD-10-CM

## 2018-10-21 DIAGNOSIS — Z7982 Long term (current) use of aspirin: Secondary | ICD-10-CM | POA: Insufficient documentation

## 2018-10-21 DIAGNOSIS — Z955 Presence of coronary angioplasty implant and graft: Secondary | ICD-10-CM | POA: Diagnosis not present

## 2018-10-21 DIAGNOSIS — Z79899 Other long term (current) drug therapy: Secondary | ICD-10-CM | POA: Insufficient documentation

## 2018-10-21 DIAGNOSIS — R21 Rash and other nonspecific skin eruption: Secondary | ICD-10-CM | POA: Diagnosis present

## 2018-10-21 MED ORDER — PREDNISONE 20 MG PO TABS: ORAL_TABLET | ORAL | 0 refills | Status: DC

## 2018-10-21 MED ORDER — PREDNISONE 50 MG PO TABS
60.0000 mg | ORAL_TABLET | Freq: Once | ORAL | Status: AC
  Administered 2018-10-22: 60 mg via ORAL
  Filled 2018-10-21: qty 1

## 2018-10-21 MED ORDER — FAMOTIDINE 20 MG PO TABS: 20.0000 mg | ORAL_TABLET | Freq: Two times a day (BID) | ORAL | 0 refills | Status: DC

## 2018-10-21 MED ORDER — METHYLPREDNISOLONE SODIUM SUCC 125 MG IJ SOLR: 125.0000 mg | Freq: Once | INTRAMUSCULAR | Status: DC

## 2018-10-21 MED ORDER — FAMOTIDINE 20 MG PO TABS
20.0000 mg | ORAL_TABLET | Freq: Once | ORAL | Status: AC
  Administered 2018-10-22: 20 mg via ORAL
  Filled 2018-10-21: qty 1

## 2018-10-21 MED ORDER — FAMOTIDINE IN NACL 20-0.9 MG/50ML-% IV SOLN: 20.0000 mg | Freq: Once | INTRAVENOUS | Status: DC

## 2018-10-21 NOTE — Discharge Instructions (Addendum)
Avoid that body wash in the future.  Also avoid all blue dye products.  Stay cool, heat will make the itching and the rash worse.  You can take the Benadryl 1 to 2 capsules every 6 hours as needed for itching or rash.  This will make you sleepy.  You can take Claritin or Zyrtec 1 tablet over-the-counter once a day and these will be less sedating.  Take the prednisone and Pepcid until gone.  Recheck if you have any difficulty breathing, swallowing, or get swelling in your mouth, throat, or tongue.

## 2018-10-21 NOTE — ED Triage Notes (Signed)
Pt C/O whelps that started in at his feet and ankles. Pt stating that the whelps are now on both arms, torso, and back. Denies tongue or lip swelling.

## 2018-10-21 NOTE — ED Provider Notes (Signed)
[facility] EMERGENCY DEPARTMENT Provider Note   CSN: [id number] Arrival date & time: 10/21/18  2209  Time seen 11:40 PM  History   Chief Complaint Chief Complaint  Patient presents with  . Allergic Reaction    HPI Eddie Lewis is a 58 y.o. male.     HPI patient states he started having a rash on July 21.  He states it was on his arms, waistline, inner and outer thighs, and over his ribs.  He describes it as raised itching rash that would move around.  He states it started after he bought a blue dyed body wash he is used in the past.  He has noticed a problem with blue dyes in the past.  He states normally he would just have a small rash in his axilla.  He states the last time he used the blue soap was yesterday morning.  He states his afternoon his arms felt hot and tingly and then he broke out in the rash and was itching.  It then started on his back and then was all over his body.  He showed me pictures showing large coalescing urticarial type lesions on his arms and his side.  He took Benadryl 25 mg around 9:30 PM and the itching and rash has improved.  He denies any shortness of breath or feeling he had swelling of his throat.  He denies any other changes including his medications.  PCP Dr Terrence Dupont  Past Medical History:  Diagnosis Date  . Kidney stones 2006  . Myocardial infarction [facility])     Patient Active Problem List   Diagnosis Date Noted  . STEMI (ST elevation myocardial infarction) ([place]) 03/18/2018  . Coronary artery disease involving native coronary artery of native heart with unstable angina pectoris ([place]) 03/17/2018  . Acute ST elevation myocardial infarction (STEMI) of inferolateral wall ([place]) 03/17/2018  . Acute combined systolic and diastolic heart failure (HCC) - DHF noted, EF not known 03/17/2018  . Essential hypertension 03/17/2018  . Hyperlipidemia with target LDL less than 70 03/17/2018  . Chest pain 03/04/2014  . Unstable angina ([place]) 03/04/2014     Past Surgical History:  Procedure Laterality Date  . CARDIAC CATHETERIZATION    . CORONARY STENT PLACEMENT  11/28/11   2nd stent on 12/03/11  . CORONARY/GRAFT ACUTE MI REVASCULARIZATION N/A 03/17/2018   Procedure: Coronary/Graft Acute MI Revascularization;  Surgeon: Leonie Man, MD;  Location: [place] CV LAB;  Service: Cardiovascular;  Laterality: N/A;  . LEFT AND RIGHT HEART CATHETERIZATION WITH CORONARY ANGIOGRAM  11/29/2011   Procedure: LEFT AND RIGHT HEART CATHETERIZATION WITH CORONARY ANGIOGRAM;  Surgeon: Clent Demark, MD;  Location: [place] CATH LAB;  Service: Cardiovascular;;  . LEFT HEART CATH AND CORONARY ANGIOGRAPHY N/A 03/17/2018   Procedure: LEFT HEART CATH AND CORONARY ANGIOGRAPHY;  Surgeon: Leonie Man, MD;  Location: [place] CV LAB;  Service: Cardiovascular;  Laterality: N/A;  . LEFT HEART CATHETERIZATION WITH CORONARY ANGIOGRAM N/A 12/03/2011   Procedure: LEFT HEART CATHETERIZATION WITH CORONARY ANGIOGRAM;  Surgeon: Clent Demark, MD;  Location: [place] CATH LAB;  Service: Cardiovascular;  Laterality: N/A;  . NO PAST SURGERIES    . PERCUTANEOUS CORONARY STENT INTERVENTION (PCI-S)  11/29/2011   Procedure: PERCUTANEOUS CORONARY STENT INTERVENTION (PCI-S);  Surgeon: Clent Demark, MD;  [facility];  Service: Cardiovascular;;        Home Medications    Prior to Admission medications   Medication Sig Start Date End Date Taking? Authorizing  Provider  aspirin EC 81 MG tablet Take 81 mg by mouth daily.    [provider]  atorvastatin (LIPITOR) 80 MG tablet Take 1 tablet (80 mg total) by mouth daily with breakfast. Patient not taking: Reported on 03/17/2018 03/06/14   Rinaldo CloudHarwani, Mohan, MD  famotidine (PEPCID) 20 MG tablet Take 1 tablet (20 mg total) by mouth 2 (two) times daily. 10/21/18   Devoria AlbeKnapp, Terrace Chiem, MD  losartan (COZAAR) 25 MG tablet Take 1 tablet (25 mg total) by mouth daily. 03/20/18   Rinaldo CloudHarwani, Mohan, MD  metoprolol succinate (TOPROL-XL) 25 MG 24 hr  tablet Take 25 mg by mouth daily.    [provider]  nitroGLYCERIN (NITROSTAT) 0.4 MG SL tablet Place 1 tablet (0.4 mg total) under the tongue every 5 (five) minutes x 3 doses as needed for chest pain. 03/06/14 03/17/18  Rinaldo CloudHarwani, Mohan, MD  polyethylene glycol powder (GLYCOLAX/MIRALAX) powder Take 17 g by mouth daily. Patient not taking: Reported on 03/17/2018 10/01/14   Palumbo, April, MD  predniSONE (DELTASONE) 20 MG tablet Take 3 po QD x 3d , then 2 po QD x 3d then 1 po QD x 3d 10/21/18   Devoria AlbeKnapp, Jazzalynn Rhudy, MD  ticagrelor (BRILINTA) 90 MG TABS tablet Take 1 tablet (90 mg total) by mouth 2 (two) times daily. 03/19/18   Rinaldo CloudHarwani, Mohan, MD    Family History Family History  Problem Relation Age of Onset  . Other Father     Social History Social History   Tobacco Use  . Smoking status: Current Every Day Smoker    Packs/day: 1.00    Years: 33.00    Pack years: 33.00    Last attempt to quit: 11/27/2011    Years since quitting: 6.9  . Smokeless tobacco: Never Used  Substance Use Topics  . Alcohol use: Yes  . Drug use: No  employed   Allergies   Codeine   Review of Systems Review of Systems  All other systems reviewed and are negative.    Physical Exam Updated Vital Signs BP 139/86   Pulse 90   Temp 97.9 F (36.6 C) (Oral)   Resp 17   Ht 5\' 3"  (1.6 m)   Wt 90.7 kg   SpO2 96%   BMI 35.43 kg/m   Physical Exam Nursing note reviewed. Exam conducted with a chaperone present.  Constitutional:      Appearance: Normal appearance. He is obese.  HENT:     Head: Normocephalic and atraumatic.     Right Ear: External ear normal.     Left Ear: External ear normal.     Nose: Nose normal.     Mouth/Throat:     Mouth: Mucous membranes are moist.     Pharynx: Oropharynx is clear.     Comments: No edema noted Eyes:     Extraocular Movements: Extraocular movements intact.     Conjunctiva/sclera: Conjunctivae normal.     Pupils: Pupils are equal, round, and reactive to light.   Neck:     Musculoskeletal: Normal range of motion.  Cardiovascular:     Rate and Rhythm: Normal rate.  Pulmonary:     Effort: Pulmonary effort is normal. No respiratory distress.  Musculoskeletal: Normal range of motion.  Skin:    General: Skin is warm and dry.     Capillary Refill: Capillary refill takes less than 2 seconds.     Findings: Rash present.     Comments: Patient showed me pictures from earlier this evening with the large coalescing  urticarial lesions.  He now just has some mild areas of some diffuse redness with rare small urticarial type lesions on his upper inner arms, on his back, his upper abdomen.  Neurological:     General: No focal deficit present.     Mental Status: He is alert and oriented to person, place, and time.     Cranial Nerves: No cranial nerve deficit.  Psychiatric:        Mood and Affect: Mood normal.        Behavior: Behavior normal.        Thought Content: Thought content normal.      ED Treatments / Results  Labs (all labs ordered are listed, but only abnormal results are displayed) Labs Reviewed - No data to display  EKG None  Radiology No results found.  Procedures Procedures (including critical care time)  Medications Ordered in ED Medications  predniSONE (DELTASONE) tablet 60 mg (has no administration in time range)  famotidine (PEPCID) tablet 20 mg (has no administration in time range)     Initial Impression / Assessment and Plan / ED Course  I have reviewed the triage vital signs and the nursing notes.  Pertinent labs & imaging results that were available during my care of the patient were reviewed by me and considered in my medical decision making (see chart for details).    Patient drove himself to the ED.  I was going to give him IV Pepcid and Solu-Medrol, however he states he wants to go to work in the morning and he would prefer to do something quicker such as taking the pills and going home.  Since his symptoms are  already improving I felt this was reasonable.  He was given oral prednisone and Pepcid.  He already has Benadryl at home.  He was advised he could take Claritin and Zyrtec over-the-counter as a substitute for the Benadryl because of the drowsiness.  He should return if he gets any type of swelling in his throat or mouth or he seems worse.  We also discussed avoiding the blue dyes in the future.  Final Clinical Impressions(s) / ED Diagnoses   Final diagnoses:  Allergic reaction, initial encounter  Urticaria    ED Discharge Orders         Ordered    famotidine (PEPCID) 20 MG tablet  2 times daily,   Status:  Discontinued     10/21/18 2359    predniSONE (DELTASONE) 20 MG tablet     10/21/18 2359    famotidine (PEPCID) 20 MG tablet  2 times daily     10/21/18 2359         Plan discharge  Devoria AlbeIva Edd Reppert, MD, Concha PyoFACEP    Gracious Renken, MD 10/22/18 [phone]

## 2018-12-06 ENCOUNTER — Emergency Department (HOSPITAL_COMMUNITY)
Admission: EM | Admit: 2018-12-06 | Discharge: 2018-12-06 | Disposition: A | Discharge status: Home / Self Care | Payer: BC Managed Care – PPO | Attending: Emergency Medicine | Admitting: Emergency Medicine

## 2018-12-06 ENCOUNTER — Encounter (HOSPITAL_COMMUNITY): Payer: Self-pay | Admitting: *Deleted

## 2018-12-06 ENCOUNTER — Other Ambulatory Visit: Payer: Self-pay

## 2018-12-06 DIAGNOSIS — M545 Low back pain: Secondary | ICD-10-CM | POA: Diagnosis present

## 2018-12-06 DIAGNOSIS — I110 Hypertensive heart disease with heart failure: Secondary | ICD-10-CM | POA: Diagnosis not present

## 2018-12-06 DIAGNOSIS — Z87442 Personal history of urinary calculi: Secondary | ICD-10-CM | POA: Diagnosis not present

## 2018-12-06 DIAGNOSIS — M5416 Radiculopathy, lumbar region: Secondary | ICD-10-CM | POA: Diagnosis not present

## 2018-12-06 DIAGNOSIS — Z7982 Long term (current) use of aspirin: Secondary | ICD-10-CM | POA: Diagnosis not present

## 2018-12-06 DIAGNOSIS — F17210 Nicotine dependence, cigarettes, uncomplicated: Secondary | ICD-10-CM | POA: Insufficient documentation

## 2018-12-06 DIAGNOSIS — M5432 Sciatica, left side: Secondary | ICD-10-CM

## 2018-12-06 DIAGNOSIS — I5042 Chronic combined systolic (congestive) and diastolic (congestive) heart failure: Secondary | ICD-10-CM | POA: Diagnosis not present

## 2018-12-06 DIAGNOSIS — M5442 Lumbago with sciatica, left side: Secondary | ICD-10-CM | POA: Diagnosis not present

## 2018-12-06 DIAGNOSIS — Z79899 Other long term (current) drug therapy: Secondary | ICD-10-CM | POA: Insufficient documentation

## 2018-12-06 DIAGNOSIS — I252 Old myocardial infarction: Secondary | ICD-10-CM | POA: Insufficient documentation

## 2018-12-06 MED ORDER — ORPHENADRINE CITRATE ER 100 MG PO TB12: 100.0000 mg | ORAL_TABLET | Freq: Two times a day (BID) | ORAL | 0 refills | Status: DC

## 2018-12-06 MED ORDER — PREDNISONE 10 MG (21) PO TBPK: ORAL_TABLET | Freq: Every day | ORAL | 0 refills | Status: DC

## 2018-12-06 NOTE — ED Provider Notes (Signed)
[facility] Provider Note   CSN: [id number] Arrival date & time: 12/06/18  1233     History   Chief Complaint Chief Complaint  Patient presents with  . Hip Pain    Left    HPI Eddie Lewis is a 58 y.o. male.     58 year old male presents with complaint of pain in his left buttock radiating around to his left groin and thigh area.  Reports numb sensation in his left thigh, feels like his leg is going to give out on him on occasion.  Denies falls or injuries, states symptoms developed over the past week or so.  Denies abdominal pain, loss of bowel or bladder control, groin numbness.  Patient has been taking Tylenol with limited relief of his pain.  Past medical history of MI, hypertension.  No other complaints or concerns.     Past Medical History:  Diagnosis Date  . Kidney stones 2006  . Myocardial infarction [facility](HCC)     Patient Active Problem List   Diagnosis Date Noted  . STEMI (ST elevation myocardial infarction) (HCC) 03/18/2018  . Coronary artery disease involving native coronary artery of native heart with unstable angina pectoris (HCC) 03/17/2018  . Acute ST elevation myocardial infarction (STEMI) of inferolateral wall (HCC) 03/17/2018  . Acute combined systolic and diastolic heart failure (HCC) - DHF noted, EF not known 03/17/2018  . Essential hypertension 03/17/2018  . Hyperlipidemia with target LDL less than 70 03/17/2018  . Chest pain 03/04/2014  . Unstable angina (HCC) 03/04/2014    Past Surgical History:  Procedure Laterality Date  . CARDIAC CATHETERIZATION    . CORONARY STENT PLACEMENT  11/28/11   2nd stent on 12/03/11  . CORONARY/GRAFT ACUTE MI REVASCULARIZATION N/A 03/17/2018   Procedure: Coronary/Graft Acute MI Revascularization;  Surgeon: Marykay LexHarding, Rino W, MD;  [facility];  Service: Cardiovascular;  Laterality: N/A;  . LEFT AND RIGHT HEART CATHETERIZATION WITH CORONARY ANGIOGRAM  11/29/2011   Procedure: LEFT AND RIGHT HEART CATHETERIZATION WITH CORONARY ANGIOGRAM;  Surgeon: Robynn PaneMohan N Harwani, MD;  Location: [facility] CATH LAB;  Service: Cardiovascular;;  . LEFT HEART CATH AND CORONARY ANGIOGRAPHY N/A 03/17/2018   Procedure: LEFT HEART CATH AND CORONARY ANGIOGRAPHY;  Surgeon: Marykay LexHarding, Franchot W, MD;  [facility];  Service: Cardiovascular;  Laterality: N/A;  . LEFT HEART CATHETERIZATION WITH CORONARY ANGIOGRAM N/A 12/03/2011   Procedure: LEFT HEART CATHETERIZATION WITH CORONARY ANGIOGRAM;  Surgeon: Robynn PaneMohan N Harwani, MD;  Location: [facility] CATH LAB;  Service: Cardiovascular;  Laterality: N/A;  . NO PAST SURGERIES    . PERCUTANEOUS CORONARY STENT INTERVENTION (PCI-S)  11/29/2011   Procedure: PERCUTANEOUS CORONARY STENT INTERVENTION (PCI-S);  Surgeon: Robynn PaneMohan N Harwani, MD;  [facility];  Service: Cardiovascular;;        Home Medications    Prior to Admission medications   Medication Sig Start Date End Date Taking? Authorizing Provider  aspirin EC 81 MG tablet Take 81 mg by mouth daily.    [provider]  atorvastatin (LIPITOR) 80 MG tablet Take 1 tablet (80 mg total) by mouth daily with breakfast. Patient not taking: Reported on 03/17/2018 03/06/14   Rinaldo CloudHarwani, Mohan, MD  famotidine (PEPCID) 20 MG tablet Take 1 tablet (20 mg total) by mouth 2 (two) times daily. 10/21/18   Devoria AlbeKnapp, Iva, MD  losartan (COZAAR) 25 MG tablet Take 1 tablet (25 mg total) by mouth daily. 03/20/18   Rinaldo CloudHarwani, Mohan, MD  metoprolol succinate (TOPROL-XL) 25  MG 24 hr tablet Take 25 mg by mouth daily.    [provider]  nitroGLYCERIN (NITROSTAT) 0.4 MG SL tablet Place 1 tablet (0.4 mg total) under the tongue every 5 (five) minutes x 3 doses as needed for chest pain. 03/06/14 03/17/18  Charolette Forward, MD  orphenadrine (NORFLEX) 100 MG tablet Take 1 tablet (100 mg total) by mouth 2 (two) times daily. 12/06/18   Tacy Learn, PA-C  polyethylene glycol powder (GLYCOLAX/MIRALAX) powder Take 17 g by  mouth daily. Patient not taking: Reported on 03/17/2018 10/01/14   Palumbo, April, MD  predniSONE (STERAPRED UNI-PAK 21 TAB) 10 MG (21) TBPK tablet Take by mouth daily. Take 6 tabs by mouth daily  for 2 days, then 5 tabs for 2 days, then 4 tabs for 2 days, then 3 tabs for 2 days, 2 tabs for 2 days, then 1 tab by mouth daily for 2 days 12/06/18   Tacy Learn, PA-C  ticagrelor (BRILINTA) 90 MG TABS tablet Take 1 tablet (90 mg total) by mouth 2 (two) times daily. 03/19/18   Charolette Forward, MD    Family History Family History  Problem Relation Age of Onset  . Other Father     Social History Social History   Tobacco Use  . Smoking status: Current Every Day Smoker    Packs/day: 1.00    Years: 33.00    Pack years: 33.00    Last attempt to quit: 11/27/2011    Years since quitting: 7.0  . Smokeless tobacco: Never Used  Substance Use Topics  . Alcohol use: Yes  . Drug use: No     Allergies   Codeine   Review of Systems Review of Systems  Constitutional: Negative for fever.  Gastrointestinal: Negative for abdominal pain, constipation and diarrhea.  Genitourinary: Negative for decreased urine volume and difficulty urinating.  Musculoskeletal: Positive for back pain, gait problem and myalgias. Negative for joint swelling.  Skin: Negative for rash and wound.  Allergic/Immunologic: Negative for immunocompromised state.  Neurological: Positive for numbness. Negative for weakness.  Psychiatric/Behavioral: Negative for confusion.  All other systems reviewed and are negative.    Physical Exam Updated Vital Signs BP (!) 150/93 (BP Location: Right Arm)   Pulse 78   Temp 98.2 F (36.8 C) (Oral)   Resp 16   Ht 5\' 4"  (1.626 m)   Wt 86.2 kg   SpO2 95%   BMI 32.61 kg/m   Physical Exam Vitals signs and nursing note reviewed.  Constitutional:      General: He is not in acute distress.    Appearance: He is well-developed. He is not diaphoretic.  HENT:     Head: Normocephalic and  atraumatic.  Cardiovascular:     Pulses: Normal pulses.  Pulmonary:     Effort: Pulmonary effort is normal.  Musculoskeletal:        General: No swelling, deformity or signs of injury.     Left hip: Normal.     Lumbar back: He exhibits tenderness. He exhibits no bony tenderness.       Back:     Right lower leg: No edema.     Left lower leg: No edema.  Skin:    General: Skin is warm and dry.     Findings: No erythema or rash.  Neurological:     Mental Status: He is alert and oriented to person, place, and time.     Sensory: Sensation is intact. No sensory deficit.     Motor:  No weakness.     Deep Tendon Reflexes: Reflexes normal. Babinski sign absent on the right side. Babinski sign absent on the left side.     Reflex Scores:      Patellar reflexes are 1+ on the right side and 1+ on the left side.      Achilles reflexes are 1+ on the right side and 1+ on the left side. Psychiatric:        Behavior: Behavior normal.      ED Treatments / Results  Labs (all labs ordered are listed, but only abnormal results are displayed) Labs Reviewed - No data to display  EKG None  Radiology No results found.  Procedures Procedures (including critical care time)  Medications Ordered in ED Medications - No data to display   Initial Impression / Assessment and Plan / ED Course  I have reviewed the triage vital signs and the nursing notes.  Pertinent labs & imaging results that were available during my care of the patient were reviewed by me and considered in my medical decision making (see chart for details).  Clinical Course as of Dec 06 1410  Wynelle Link Dec 06, 2018  [id number] 58 year old male with left lower back pain rating to left groin and left thigh area.  On exam has tenderness at the left SI joint, pain on left with left leg raise.  Leg strength and reflexes symmetric, pulses present symmetric.  Discussed sciatica with patient, recommend warm compresses, stretching, given prednisone  and Norflex.  Patient to follow-up with PCP, does not have a PCP and will contact his insurance for assistance locate local PCP.   [LM]    Clinical Course User Index [LM] Jeannie Fend, PA-C      Final Clinical Impressions(s) / ED Diagnoses   Final diagnoses:  Sciatica of left side  Lumbar radiculopathy    ED Discharge Orders         Ordered    predniSONE (STERAPRED UNI-PAK 21 TAB) 10 MG (21) TBPK tablet  Daily     12/06/18 1328    orphenadrine (NORFLEX) 100 MG tablet  2 times daily     12/06/18 1328           Alden Hipp 12/06/18 1412    Gerhard Munch, MD 12/07/18 [phone]

## 2018-12-06 NOTE — ED Notes (Signed)
Patient verbalizes understanding of discharge instructions . Opportunity for questions and answers were provided . Armband removed by staff ,Pt discharged from ED. W/C  offered at D/C  and Declined W/C at D/C and was escorted to lobby by RN.  

## 2018-12-06 NOTE — Discharge Instructions (Addendum)
Call your insurance for assistance locating a PCP. Warm compresses to area for 30 minutes three times daily with stretches as demonstrated. Take Prednisone as prescribed and complete the full course.  Take Norflex as needed, do not drive or machinery if taking Norflex.

## 2018-12-06 NOTE — ED Triage Notes (Signed)
Pt reports Lt hip pain with numbness into Lt leg. Pt denies any injury to Lt hip or leg. Pt able to ambulate with limp. Pt reported he did not trust his leg .

## 2019-04-01 ENCOUNTER — Other Ambulatory Visit: Payer: Self-pay

## 2019-04-01 ENCOUNTER — Ambulatory Visit: Payer: BC Managed Care – PPO | Attending: Internal Medicine

## 2019-04-01 DIAGNOSIS — Z20822 Contact with and (suspected) exposure to covid-19: Secondary | ICD-10-CM

## 2019-04-03 LAB — NOVEL CORONAVIRUS, NAA: SARS-CoV-2, NAA: NOT DETECTED

## 2020-03-08 ENCOUNTER — Other Ambulatory Visit: Payer: Self-pay

## 2020-03-08 ENCOUNTER — Encounter (HOSPITAL_COMMUNITY): Payer: Self-pay | Admitting: Emergency Medicine

## 2020-03-08 ENCOUNTER — Emergency Department (HOSPITAL_COMMUNITY)
Admission: EM | Admit: 2020-03-08 | Discharge: 2020-03-09 | Disposition: A | Discharge status: Home / Self Care | Payer: No Typology Code available for payment source | Attending: Emergency Medicine | Admitting: Emergency Medicine

## 2020-03-08 DIAGNOSIS — Z79899 Other long term (current) drug therapy: Secondary | ICD-10-CM | POA: Diagnosis not present

## 2020-03-08 DIAGNOSIS — F17200 Nicotine dependence, unspecified, uncomplicated: Secondary | ICD-10-CM | POA: Diagnosis not present

## 2020-03-08 DIAGNOSIS — Z955 Presence of coronary angioplasty implant and graft: Secondary | ICD-10-CM | POA: Diagnosis not present

## 2020-03-08 DIAGNOSIS — I110 Hypertensive heart disease with heart failure: Secondary | ICD-10-CM | POA: Insufficient documentation

## 2020-03-08 DIAGNOSIS — Z7982 Long term (current) use of aspirin: Secondary | ICD-10-CM | POA: Diagnosis not present

## 2020-03-08 DIAGNOSIS — I25110 Atherosclerotic heart disease of native coronary artery with unstable angina pectoris: Secondary | ICD-10-CM | POA: Insufficient documentation

## 2020-03-08 DIAGNOSIS — U071 COVID-19: Secondary | ICD-10-CM | POA: Insufficient documentation

## 2020-03-08 DIAGNOSIS — I5041 Acute combined systolic (congestive) and diastolic (congestive) heart failure: Secondary | ICD-10-CM | POA: Diagnosis not present

## 2020-03-08 DIAGNOSIS — R0602 Shortness of breath: Secondary | ICD-10-CM | POA: Diagnosis present

## 2020-03-08 MED ORDER — ALBUTEROL SULFATE HFA 108 (90 BASE) MCG/ACT IN AERS
2.0000 | INHALATION_SPRAY | Freq: Once | RESPIRATORY_TRACT | Status: AC
  Administered 2020-03-08: 2 via RESPIRATORY_TRACT
  Filled 2020-03-08: qty 6.7

## 2020-03-08 NOTE — ED Provider Notes (Signed)
[facility] EMERGENCY DEPARTMENT Provider Note   CSN: [id number] Arrival date & time: 03/08/20  1551     History No chief complaint on file.   Eddie Lewis is a 59 y.o. male here with possible Covid test.  Patient states that he works for [employer] and has been in and out of people's houses all the time.  Patient states that he has been having some chills and low-grade temperature for the last week or so.  He went to be tested for Covid on 12/5 and results came back today for Covid.  He states that he has mild shortness of breath.  He states that he has general myalgias.  He has been still running fevers 99-101.  He did not receive the Covid vaccine.  Patient talked to his doctor and was sent here for further evaluation  The history is provided by the patient.       Past Medical History:  Diagnosis Date  . Kidney stones 2006  . Myocardial infarction [facility])     Patient Active Problem List   Diagnosis Date Noted  . STEMI (ST elevation myocardial infarction) (HCC) 03/18/2018  . Coronary artery disease involving native coronary artery of native heart with unstable angina pectoris (HCC) 03/17/2018  . Acute ST elevation myocardial infarction (STEMI) of inferolateral wall (HCC) 03/17/2018  . Acute combined systolic and diastolic heart failure (HCC) - DHF noted, EF not known 03/17/2018  . Essential hypertension 03/17/2018  . Hyperlipidemia with target LDL less than 70 03/17/2018  . Chest pain 03/04/2014  . Unstable angina (HCC) 03/04/2014    Past Surgical History:  Procedure Laterality Date  . CARDIAC CATHETERIZATION    . CORONARY STENT PLACEMENT  11/28/11   2nd stent on 12/03/11  . CORONARY/GRAFT ACUTE MI REVASCULARIZATION N/A 03/17/2018   Procedure: Coronary/Graft Acute MI Revascularization;  Surgeon: Marykay Lex, MD;  Location: [facility] INVASIVE CV LAB;  Service: Cardiovascular;  Laterality: N/A;  . LEFT AND RIGHT HEART CATHETERIZATION WITH CORONARY  ANGIOGRAM  11/29/2011   Procedure: LEFT AND RIGHT HEART CATHETERIZATION WITH CORONARY ANGIOGRAM;  Surgeon: Robynn Pane, MD;  Location: [facility] CATH LAB;  Service: Cardiovascular;;  . LEFT HEART CATH AND CORONARY ANGIOGRAPHY N/A 03/17/2018   Procedure: LEFT HEART CATH AND CORONARY ANGIOGRAPHY;  Surgeon: Marykay Lex, MD;  Location: [facility] INVASIVE CV LAB;  Service: Cardiovascular;  Laterality: N/A;  . LEFT HEART CATHETERIZATION WITH CORONARY ANGIOGRAM N/A 12/03/2011   Procedure: LEFT HEART CATHETERIZATION WITH CORONARY ANGIOGRAM;  Surgeon: Robynn Pane, MD;  Location: [facility] CATH LAB;  Service: Cardiovascular;  Laterality: N/A;  . NO PAST SURGERIES    . PERCUTANEOUS CORONARY STENT INTERVENTION (PCI-S)  11/29/2011   Procedure: PERCUTANEOUS CORONARY STENT INTERVENTION (PCI-S);  Surgeon: Robynn Pane, MD;  Location: [facility] CATH LAB;  Service: Cardiovascular;;       Family History  Problem Relation Age of Onset  . Other Father     Social History   Tobacco Use  . Smoking status: Current Every Day Smoker    Packs/day: 1.00    Years: 33.00    Pack years: 33.00    Last attempt to quit: 11/27/2011    Years since quitting: 8.2  . Smokeless tobacco: Never Used  Substance Use Topics  . Alcohol use: Yes  . Drug use: No    Home Medications Prior to Admission medications   Medication Sig Start Date End Date Taking? Authorizing Provider  aspirin EC 81 MG tablet Take  81 mg by mouth daily.    [provider]  atorvastatin (LIPITOR) 80 MG tablet Take 1 tablet (80 mg total) by mouth daily with breakfast. Patient not taking: Reported on 03/17/2018 03/06/14   Rinaldo Cloud, MD  famotidine (PEPCID) 20 MG tablet Take 1 tablet (20 mg total) by mouth 2 (two) times daily. 10/21/18   Devoria Albe, MD  losartan (COZAAR) 25 MG tablet Take 1 tablet (25 mg total) by mouth daily. 03/20/18   Rinaldo Cloud, MD  metoprolol succinate (TOPROL-XL) 25 MG 24 hr tablet Take 25 mg by mouth daily.    [provider]  nitroGLYCERIN (NITROSTAT) 0.4 MG SL tablet Place 1 tablet (0.4 mg total) under the tongue every 5 (five) minutes x 3 doses as needed for chest pain. 03/06/14 03/17/18  Rinaldo Cloud, MD  orphenadrine (NORFLEX) 100 MG tablet Take 1 tablet (100 mg total) by mouth 2 (two) times daily. 12/06/18   Jeannie Fend, PA-C  polyethylene glycol powder (GLYCOLAX/MIRALAX) powder Take 17 g by mouth daily. Patient not taking: Reported on 03/17/2018 10/01/14   Palumbo, April, MD  predniSONE (STERAPRED UNI-PAK 21 TAB) 10 MG (21) TBPK tablet Take by mouth daily. Take 6 tabs by mouth daily  for 2 days, then 5 tabs for 2 days, then 4 tabs for 2 days, then 3 tabs for 2 days, 2 tabs for 2 days, then 1 tab by mouth daily for 2 days 12/06/18   Jeannie Fend, PA-C  ticagrelor (BRILINTA) 90 MG TABS tablet Take 1 tablet (90 mg total) by mouth 2 (two) times daily. 03/19/18   Rinaldo Cloud, MD    Allergies    Codeine  Review of Systems   Review of Systems  Respiratory: Positive for cough.   All other systems reviewed and are negative.   Physical Exam Updated Vital Signs BP 138/80   Pulse 98   Temp 98.2 F (36.8 C) (Oral)   Resp 17   SpO2 98%   Physical Exam Vitals and nursing note reviewed.  Constitutional:      Appearance: Normal appearance.  HENT:     Head: Normocephalic.     Mouth/Throat:     Mouth: Mucous membranes are moist.  Eyes:     Extraocular Movements: Extraocular movements intact.     Pupils: Pupils are equal, round, and reactive to light.  Cardiovascular:     Rate and Rhythm: Normal rate and regular rhythm.     Pulses: Normal pulses.     Heart sounds: Normal heart sounds.  Pulmonary:     Comments: slightly tachypneic and slightly diminished but no wheezing or crackles Abdominal:     General: Abdomen is flat.     Palpations: Abdomen is soft.  Musculoskeletal:        General: Normal range of motion.     Cervical back: Normal range of motion and neck supple.  Skin:     General: Skin is warm.     Capillary Refill: Capillary refill takes less than 2 seconds.  Neurological:     General: No focal deficit present.     Mental Status: He is alert.  Psychiatric:        Mood and Affect: Mood normal.        Behavior: Behavior normal.     ED Results / Procedures / Treatments   Labs (all labs ordered are listed, but only abnormal results are displayed) Labs Reviewed - No data to display  EKG None  Radiology No results  found.  Procedures Procedures (including critical care time)  Medications Ordered in ED Medications  albuterol (VENTOLIN HFA) 108 (90 Base) MCG/ACT inhaler 2 puff (2 puffs Inhalation Given 03/08/20 2101)    ED Course  I have reviewed the triage vital signs and the nursing notes.  Pertinent labs & imaging results that were available during my care of the patient were reviewed by me and considered in my medical decision making (see chart for details).    MDM Rules/Calculators/A&P                         WATARU MCCOWEN is a 59 y.o. male here presenting with cough and shortness of breath.  Patient is positive Covid.  His oxygen level is normal right now.  Since he has CAD he will qualify for antibiotic infusion.  I left a message with the infusion group.  Patient stable for discharge right now.  Told him to get a pulse ox and return to ER if his oxygen is less than 90%.   Final Clinical Impression(s) / ED Diagnoses Final diagnoses:  None    Rx / DC Orders ED Discharge Orders    None       Charlynne Pander, MD 03/08/20 2106

## 2020-03-08 NOTE — ED Triage Notes (Signed)
Patient sent to ED for evaluation. Patient states he received positive result this morning from a test completed on 12/4. Patient complains of productive cough and temperatures between 99-102F. Patient alert, oriented, and ambulatory.

## 2020-03-08 NOTE — Discharge Instructions (Signed)
You have Covid and we will need to quarantine for 10 days  Use albuterol as needed for cough.  Take Tylenol Motrin for fever  Stay hydrated.  See your doctor for follow-up.  Get a pulse ox and if your oxygen is less than 90% return to the ED.  Also return to the ER if you have worsening shortness of breath and vomiting and dehydration.

## 2020-03-09 ENCOUNTER — Encounter: Payer: Self-pay | Admitting: Physician Assistant

## 2020-03-09 ENCOUNTER — Telehealth: Payer: Self-pay | Admitting: Physician Assistant

## 2020-03-09 DIAGNOSIS — I2510 Atherosclerotic heart disease of native coronary artery without angina pectoris: Secondary | ICD-10-CM | POA: Insufficient documentation

## 2020-03-09 DIAGNOSIS — Z6832 Body mass index (BMI) 32.0-32.9, adult: Secondary | ICD-10-CM | POA: Insufficient documentation

## 2020-03-09 NOTE — Telephone Encounter (Signed)
Called to discuss with patient about Covid symptoms and the use of sotrovimab, bamlanivimab/etesevimab or casirivimab/imdevimab, a monoclonal antibody infusion for those with mild to moderate Covid symptoms and at a high risk of hospitalization.  Pt is qualified for this infusion at the [facility] center due to; Specific high risk criteria : BMI > 25 and Cardiovascular disease or hypertension  Pt is unvaccinated and on day 7 or 8 by review of ER notes.   Message left to call back our hotline [phone]. My chart message sent if active on Mychart.   Cline Crock PA-C

## 2020-03-11 ENCOUNTER — Other Ambulatory Visit (HOSPITAL_COMMUNITY): Payer: Self-pay | Admitting: Adult Health

## 2020-03-11 ENCOUNTER — Encounter: Payer: Self-pay | Admitting: Adult Health

## 2020-03-11 ENCOUNTER — Ambulatory Visit (HOSPITAL_COMMUNITY)
Admission: RE | Admit: 2020-03-11 | Discharge: 2020-03-11 | Disposition: A | Discharge status: Home / Self Care | Payer: No Typology Code available for payment source | Source: Ambulatory Visit | Attending: Pulmonary Disease | Admitting: Pulmonary Disease

## 2020-03-11 DIAGNOSIS — U071 COVID-19: Secondary | ICD-10-CM

## 2020-03-11 MED ORDER — ALBUTEROL SULFATE HFA 108 (90 BASE) MCG/ACT IN AERS: 2.0000 | INHALATION_SPRAY | Freq: Once | RESPIRATORY_TRACT | Status: DC | PRN

## 2020-03-11 MED ORDER — SODIUM CHLORIDE 0.9 % IV SOLN: Freq: Once | INTRAVENOUS | Status: AC

## 2020-03-11 MED ORDER — SODIUM CHLORIDE 0.9 % IV SOLN: INTRAVENOUS | Status: DC | PRN

## 2020-03-11 MED ORDER — DIPHENHYDRAMINE HCL 50 MG/ML IJ SOLN: 50.0000 mg | Freq: Once | INTRAMUSCULAR | Status: DC | PRN

## 2020-03-11 MED ORDER — METHYLPREDNISOLONE SODIUM SUCC 125 MG IJ SOLR: 125.0000 mg | Freq: Once | INTRAMUSCULAR | Status: DC | PRN

## 2020-03-11 MED ORDER — EPINEPHRINE 0.3 MG/0.3ML IJ SOAJ: 0.3000 mg | Freq: Once | INTRAMUSCULAR | Status: DC | PRN

## 2020-03-11 MED ORDER — FAMOTIDINE IN NACL 20-0.9 MG/50ML-% IV SOLN: 20.0000 mg | Freq: Once | INTRAVENOUS | Status: DC | PRN

## 2020-03-11 NOTE — Progress Notes (Signed)
Patient reviewed Fact Sheet for Patients, Parents, and Caregivers for Emergency Use Authorization (EUA) of Sotrovimab for the Treatment of Coronavirus. Patient also reviewed and is agreeable to the estimated cost of treatment. Patient is agreeable to proceed.   

## 2020-03-11 NOTE — Discharge Instructions (Signed)
10 Things You Can Do to Manage Your COVID-19 Symptoms at Home If you have possible or confirmed COVID-19: 1. Stay home from work and school. And stay away from other public places. If you must go out, avoid using any kind of public transportation, ridesharing, or taxis. 2. Monitor your symptoms carefully. If your symptoms get worse, call your healthcare provider immediately. 3. Get rest and stay hydrated. 4. If you have a medical appointment, call the healthcare provider ahead of time and tell them that you have or may have COVID-19. 5. For medical emergencies, call 911 and notify the dispatch personnel that you have or may have COVID-19. 6. Cover your cough and sneezes with a tissue or use the inside of your elbow. 7. Wash your hands often with soap and water for at least 20 seconds or clean your hands with an alcohol-based hand sanitizer that contains at least 60% alcohol. 8. As much as possible, stay in a specific room and away from other people in your home. Also, you should use a separate bathroom, if available. If you need to be around other people in or outside of the home, wear a mask. 9. Avoid sharing personal items with other people in your household, like dishes, towels, and bedding. 10. Clean all surfaces that are touched often, like counters, tabletops, and doorknobs. Use household cleaning sprays or wipes according to the label instructions. cdc.gov/coronavirus 09/30/2018 This information is not intended to replace advice given to you by your health care provider. Make sure you discuss any questions you have with your health care provider. Document Revised: 03/04/2019 Document Reviewed: 03/04/2019 Elsevier Patient Education  2020 Elsevier Inc. What types of side effects do monoclonal antibody drugs cause?  Common side effects  In general, the more common side effects caused by monoclonal antibody drugs include: . Allergic reactions, such as hives or itching . Flu-like signs and  symptoms, including chills, fatigue, fever, and muscle aches and pains . Nausea, vomiting . Diarrhea . Skin rashes . Low blood pressure   The CDC is recommending patients who receive monoclonal antibody treatments wait at least 90 days before being vaccinated.  Currently, there are no data on the safety and efficacy of mRNA COVID-19 vaccines in persons who received monoclonal antibodies or convalescent plasma as part of COVID-19 treatment. Based on the estimated half-life of such therapies as well as evidence suggesting that reinfection is uncommon in the 90 days after initial infection, vaccination should be deferred for at least 90 days, as a precautionary measure until additional information becomes available, to avoid interference of the antibody treatment with vaccine-induced immune responses. If you have any questions or concerns after the infusion please call the Advanced Practice Provider on call at [phone]. This number is ONLY intended for your use regarding questions or concerns about the infusion post-treatment side-effects.  Please do not provide this number to others for use. For return to work notes please contact your primary care provider.   If someone you know is interested in receiving treatment please have them call the COVID hotline at [phone].   

## 2020-03-11 NOTE — Progress Notes (Signed)
  Diagnosis: COVID-19  Physician: Delford Field, MD  Procedure: [facility] Infusion Clinic Med: bamlanivimab\etesevimab infusion - Provided patient with bamlanimivab\etesevimab fact sheet for patients, parents and caregivers prior to infusion.  Complications: No immediate complications noted.  Discharge: Discharged home   Jaylan Hinojosa R Tyce Delcid 03/11/2020 ,

## 2020-03-11 NOTE — Progress Notes (Signed)
I connected by phone with [name] on 03/11/2020 at 11:47 AM to discuss the potential use of a new treatment for mild to moderate COVID-19 viral infection in non-hospitalized patients.  This patient is a 59 y.o. male that meets the FDA criteria for Emergency Use Authorization of COVID monoclonal antibody casirivimab/imdevimab, bamlanivimab/eteseviamb, or sotrovimab.  Has a (+) direct SARS-CoV-2 viral test result  Has mild or moderate COVID-19   Is NOT hospitalized due to COVID-19  Is within 10 days of symptom onset  Has at least one of the high risk factor(s) for progression to severe COVID-19 and/or hospitalization as defined in EUA.  Specific high risk criteria : BMI > 25, CAD, HTN, former heavy smoker   I have spoken and communicated the following to the patient or parent/caregiver regarding COVID monoclonal antibody treatment:  1. FDA has authorized the emergency use for the treatment of mild to moderate COVID-19 in adults and pediatric patients with positive results of direct SARS-CoV-2 viral testing who are 8 years of age and older weighing at least 40 kg, and who are at high risk for progressing to severe COVID-19 and/or hospitalization.  2. The significant known and potential risks and benefits of COVID monoclonal antibody, and the extent to which such potential risks and benefits are unknown.  3. Information on available alternative treatments and the risks and benefits of those alternatives, including clinical trials.  4. Patients treated with COVID monoclonal antibody should continue to self-isolate and use infection control measures (e.g., wear mask, isolate, social distance, avoid sharing personal items, clean and disinfect "high touch" surfaces, and frequent handwashing) according to CDC guidelines.   5. The patient or parent/caregiver has the option to accept or refuse COVID monoclonal antibody treatment. 6. Sx onset 12/4, cost reviewed and sent via my chart  message  After reviewing this information with the patient, the patient has agreed to receive one of the available covid 19 monoclonal antibodies and will be provided an appropriate fact sheet prior to infusion. Noreene Filbert, NP 03/11/2020 11:47 AM

## 2020-04-06 ENCOUNTER — Telehealth: Payer: Self-pay | Admitting: *Deleted

## 2020-04-06 NOTE — Telephone Encounter (Signed)
Patient called and says he had covid last month ,he was quarantined for the required time. his daughter that lives in the home tested positive today ,his company wants him to come in to work tomorrow and he would like to know if he should wait the ten days again to quarantine again,please advise  [phone].

## 2020-04-06 NOTE — Telephone Encounter (Signed)
Patient called and advised according to CDC Guidelines with a known COVID positive exposure, you do quarantine. He says that his HR at work and his wife's HR at a different company both said that if you had COVID within last 90 days not to quarantine. I advised again the CDC and [place] guidelines for exposure and advised to follow up with primary care doctor. He verbalized understanding.

## 2021-01-12 ENCOUNTER — Other Ambulatory Visit: Payer: Self-pay

## 2021-01-12 ENCOUNTER — Emergency Department (HOSPITAL_COMMUNITY): Payer: No Typology Code available for payment source

## 2021-01-12 ENCOUNTER — Emergency Department (HOSPITAL_COMMUNITY)
Admission: EM | Admit: 2021-01-12 | Discharge: 2021-01-12 | Disposition: A | Discharge status: Home / Self Care | Payer: No Typology Code available for payment source | Attending: Emergency Medicine | Admitting: Emergency Medicine

## 2021-01-12 DIAGNOSIS — Z7982 Long term (current) use of aspirin: Secondary | ICD-10-CM | POA: Insufficient documentation

## 2021-01-12 DIAGNOSIS — I2510 Atherosclerotic heart disease of native coronary artery without angina pectoris: Secondary | ICD-10-CM | POA: Diagnosis not present

## 2021-01-12 DIAGNOSIS — R079 Chest pain, unspecified: Secondary | ICD-10-CM | POA: Diagnosis not present

## 2021-01-12 DIAGNOSIS — F17210 Nicotine dependence, cigarettes, uncomplicated: Secondary | ICD-10-CM | POA: Insufficient documentation

## 2021-01-12 DIAGNOSIS — Z7984 Long term (current) use of oral hypoglycemic drugs: Secondary | ICD-10-CM | POA: Diagnosis not present

## 2021-01-12 DIAGNOSIS — I5041 Acute combined systolic (congestive) and diastolic (congestive) heart failure: Secondary | ICD-10-CM | POA: Diagnosis not present

## 2021-01-12 DIAGNOSIS — I110 Hypertensive heart disease with heart failure: Secondary | ICD-10-CM | POA: Insufficient documentation

## 2021-01-12 DIAGNOSIS — Z79899 Other long term (current) drug therapy: Secondary | ICD-10-CM | POA: Diagnosis not present

## 2021-01-12 LAB — CBC
HCT: 46.3 % (ref 39.0–52.0)
Hemoglobin: 14.1 g/dL (ref 13.0–17.0)
MCH: 27 pg (ref 26.0–34.0)
MCHC: 30.5 g/dL (ref 30.0–36.0)
MCV: 88.7 fL (ref 80.0–100.0)
Platelets: 215 10*3/uL (ref 150–400)
RBC: 5.22 MIL/uL (ref 4.22–5.81)
RDW: 13.7 % (ref 11.5–15.5)
WBC: 9.6 10*3/uL (ref 4.0–10.5)
nRBC: 0 % (ref 0.0–0.2)

## 2021-01-12 LAB — BASIC METABOLIC PANEL
Anion gap: 9 (ref 5–15)
BUN: 15 mg/dL (ref 6–20)
CO2: 26 mmol/L (ref 22–32)
Calcium: 9.4 mg/dL (ref 8.9–10.3)
Chloride: 103 mmol/L (ref 98–111)
Creatinine, Ser: 1.04 mg/dL (ref 0.61–1.24)
GFR, Estimated: >60 mL/min (ref >60)
Glucose, Bld: 117 mg/dL — ABNORMAL HIGH (ref 70–99)
Potassium: 4.2 mmol/L (ref 3.5–5.1)
Sodium: 138 mmol/L (ref 135–145)

## 2021-01-12 LAB — TROPONIN I (HIGH SENSITIVITY)
Troponin I (High Sensitivity): 5 ng/L (ref <18)
Troponin I (High Sensitivity): 5 ng/L (ref <18)

## 2021-01-12 NOTE — Discharge Instructions (Addendum)
Consider taking over-the-counter medication such as Pepcid.  Follow-up with Dr. Sharyn Lull as we discussed.  Return to the ER for any recurrent episodes

## 2021-01-12 NOTE — ED Provider Notes (Addendum)
[facility] EMERGENCY DEPARTMENT Provider Note   CSN: [id number] Arrival date & time: 01/12/21  1217     History Chief Complaint  Patient presents with   Chest Pain    Eddie Lewis is a 60 y.o. male.   Chest Pain  Patient presents to the ED for evaluation of chest pain.  Patient has a history of heart disease.  He had 2 episodes of pain in his chest today going to his back.  Was not sure if it was indigestion versus his cardiac chest pain so he wanted to come to the ED and get checked out.  He has not had any more episodes since those few minute episode bowels earlier today  Past Medical History:  Diagnosis Date   BMI 32.0-32.9,adult    CAD (coronary artery disease)    Kidney stones 2006   Myocardial infarction [facility])     Patient Active Problem List   Diagnosis Date Noted   CAD (coronary artery disease)    BMI 32.0-32.9,adult    STEMI (ST elevation myocardial infarction) (HCC) 03/18/2018   Coronary artery disease involving native coronary artery of native heart with unstable angina pectoris (HCC) 03/17/2018   Acute ST elevation myocardial infarction (STEMI) of inferolateral wall (HCC) 03/17/2018   Acute combined systolic and diastolic heart failure (HCC) - DHF noted, EF not known 03/17/2018   Essential hypertension 03/17/2018   Hyperlipidemia with target LDL less than 70 03/17/2018   Chest pain 03/04/2014   Unstable angina (HCC) 03/04/2014    Past Surgical History:  Procedure Laterality Date   CARDIAC CATHETERIZATION     CORONARY STENT PLACEMENT  11/28/11   2nd stent on 12/03/11   CORONARY/GRAFT ACUTE MI REVASCULARIZATION N/A 03/17/2018   Procedure: Coronary/Graft Acute MI Revascularization;  Surgeon: Marykay Lex, MD;  Location: [facility] INVASIVE CV LAB;  Service: Cardiovascular;  Laterality: N/A;   LEFT AND RIGHT HEART CATHETERIZATION WITH CORONARY ANGIOGRAM  11/29/2011   Procedure: LEFT AND RIGHT HEART CATHETERIZATION WITH CORONARY ANGIOGRAM;   Surgeon: Robynn Pane, MD;  Location: [facility] CATH LAB;  Service: Cardiovascular;;   LEFT HEART CATH AND CORONARY ANGIOGRAPHY N/A 03/17/2018   Procedure: LEFT HEART CATH AND CORONARY ANGIOGRAPHY;  Surgeon: Marykay Lex, MD;  Location: [facility] INVASIVE CV LAB;  Service: Cardiovascular;  Laterality: N/A;   LEFT HEART CATHETERIZATION WITH CORONARY ANGIOGRAM N/A 12/03/2011   Procedure: LEFT HEART CATHETERIZATION WITH CORONARY ANGIOGRAM;  Surgeon: Robynn Pane, MD;  Location: [facility] CATH LAB;  Service: Cardiovascular;  Laterality: N/A;   NO PAST SURGERIES     PERCUTANEOUS CORONARY STENT INTERVENTION (PCI-S)  11/29/2011   Procedure: PERCUTANEOUS CORONARY STENT INTERVENTION (PCI-S);  Surgeon: Robynn Pane, MD;  Location: [facility] CATH LAB;  Service: Cardiovascular;;       Family History  Problem Relation Age of Onset   Other Father     Social History   Tobacco Use   Smoking status: Every Day    Packs/day: 1.00    Years: 33.00    Pack years: 33.00    Types: Cigarettes    Last attempt to quit: 11/27/2011    Years since quitting: 9.1   Smokeless tobacco: Never  Substance Use Topics   Alcohol use: Yes   Drug use: No    Home Medications Prior to Admission medications   Medication Sig Start Date End Date Taking? Authorizing Provider  aspirin EC 81 MG tablet Take 81 mg by mouth daily.   Yes [provider]  atorvastatin (LIPITOR) 80 MG tablet Take 1 tablet (80 mg total) by mouth daily with breakfast. 03/06/14  Yes Rinaldo Cloud, MD  FARXIGA 10 MG TABS tablet Take 10 mg by mouth daily. 12/26/20  Yes [provider]  losartan (COZAAR) 100 MG tablet Take 100 mg by mouth daily. 12/25/20  Yes [provider]  losartan (COZAAR) 25 MG tablet Take 1 tablet (25 mg total) by mouth daily. 03/20/18  Yes Rinaldo Cloud, MD  meloxicam (MOBIC) 7.5 MG tablet Take 7.5 mg by mouth daily. 01/11/21  Yes [provider]  metFORMIN (GLUCOPHAGE-XR) 500 MG 24 hr tablet Take 500 mg by mouth  daily. 12/25/20  Yes [provider]  metoprolol succinate (TOPROL-XL) 25 MG 24 hr tablet Take 25 mg by mouth daily.   Yes [provider]  nitroGLYCERIN (NITROSTAT) 0.4 MG SL tablet Place 1 tablet (0.4 mg total) under the tongue every 5 (five) minutes x 3 doses as needed for chest pain. 03/06/14 01/12/21 Yes Rinaldo Cloud, MD  famotidine (PEPCID) 20 MG tablet Take 1 tablet (20 mg total) by mouth 2 (two) times daily. Patient not taking: No sig reported 10/21/18   Devoria Albe, MD  orphenadrine (NORFLEX) 100 MG tablet Take 1 tablet (100 mg total) by mouth 2 (two) times daily. Patient not taking: No sig reported 12/06/18   Army Melia A, PA-C  polyethylene glycol powder (GLYCOLAX/MIRALAX) powder Take 17 g by mouth daily. Patient not taking: No sig reported 10/01/14   Palumbo, April, MD  predniSONE (STERAPRED UNI-PAK 21 TAB) 10 MG (21) TBPK tablet Take by mouth daily. Take 6 tabs by mouth daily  for 2 days, then 5 tabs for 2 days, then 4 tabs for 2 days, then 3 tabs for 2 days, 2 tabs for 2 days, then 1 tab by mouth daily for 2 days Patient not taking: No sig reported 12/06/18   Jeannie Fend, PA-C  ticagrelor (BRILINTA) 90 MG TABS tablet Take 1 tablet (90 mg total) by mouth 2 (two) times daily. Patient not taking: No sig reported 03/19/18   Rinaldo Cloud, MD    Allergies    Codeine  Review of Systems   Review of Systems  Cardiovascular:  Positive for chest pain.  All other systems reviewed and are negative.  Physical Exam Updated Vital Signs BP 126/82   Pulse 78   Temp 98.7 F (37.1 C) (Oral)   Resp 13   Ht 1.6 m (5\' 3" )   Wt 86.2 kg   SpO2 95%   BMI 33.66 kg/m   Physical Exam Vitals and nursing note reviewed.  Constitutional:      General: He is not in acute distress.    Appearance: He is well-developed.  HENT:     Head: Normocephalic and atraumatic.     Right Ear: External ear normal.     Left Ear: External ear normal.  Eyes:     General: No scleral icterus.        Right eye: No discharge.        Left eye: No discharge.     Conjunctiva/sclera: Conjunctivae normal.  Neck:     Trachea: No tracheal deviation.  Cardiovascular:     Rate and Rhythm: Normal rate and regular rhythm.  Pulmonary:     Effort: Pulmonary effort is normal. No respiratory distress.     Breath sounds: Normal breath sounds. No stridor. No wheezing or rales.  Abdominal:     General: Bowel sounds are normal. There is no  distension.     Palpations: Abdomen is soft.     Tenderness: There is no abdominal tenderness. There is no guarding or rebound.  Musculoskeletal:        General: No tenderness or deformity.     Cervical back: Neck supple.  Skin:    General: Skin is warm and dry.     Findings: No rash.  Neurological:     General: No focal deficit present.     Mental Status: He is alert.     Cranial Nerves: No cranial nerve deficit (no facial droop, extraocular movements intact, no slurred speech).     Sensory: No sensory deficit.     Motor: No abnormal muscle tone or seizure activity.     Coordination: Coordination normal.  Psychiatric:        Mood and Affect: Mood normal.    ED Results / Procedures / Treatments   Labs (all labs ordered are listed, but only abnormal results are displayed) Labs Reviewed  BASIC METABOLIC PANEL - Abnormal; Notable for the following components:      Result Value   Glucose, Bld 117 (*)    All other components within normal limits  CBC  TROPONIN I (HIGH SENSITIVITY)  TROPONIN I (HIGH SENSITIVITY)    EKG EKG Interpretation  Date/Time:  Friday January 12 2021 12:13:48 EDT Ventricular Rate:  81 PR Interval:  152 QRS Duration: 112 QT Interval:  358 QTC Calculation: 415 R Axis:   -51 Text Interpretation: Normal sinus rhythm Left axis deviation Abnormal ECG t wave changes resolved compared to last tracing Confirmed by Linwood Dibbles [phone]) on 01/12/2021 2:53:51 PM  Radiology DG Chest 2 View  Result Date: 01/12/2021 CLINICAL DATA:   Chest pain EXAM: CHEST - 2 VIEW COMPARISON:  03/17/2018 FINDINGS: The heart size and mediastinal contours are within normal limits. Bilateral lower lobe nodular opacities are favored to represent nipple artifact. No pleural effusion or edema. Both lungs are clear. The visualized skeletal structures are unremarkable. IMPRESSION: 1. No active cardiopulmonary disease. 2. Bilateral lower lobe nodular opacities are favored to represent nipple artifact. This could be confirmed with repeat PA chest radiograph with nipple markers. Electronically Signed   By: Signa Kell M.D.   On: 01/12/2021 13:14    Procedures Procedures   Medications Ordered in ED Medications - No data to display  ED Course  I have reviewed the triage vital signs and the nursing notes.  Pertinent labs & imaging results that were available during my care of the patient were reviewed by me and considered in my medical decision making (see chart for details).    MDM Rules/Calculators/A&P HEAR Score: 4                         Patient presents to the ED for evaluation of chest pain.  Patient does have a history of coronary artery disease but also has had issues with gastroesophageal reflux disease.  Patient does have symptoms concerning for recurrent cardiac etiology however symptoms lasted a few minutes at a time.  He has been pain-free now for several hours.  Moderate risk heart score.  Initial troponin is normal.  EKG is reassuring.  Plan on delta troponin.  If normal anticipate outpatient follow-up with cardiology Final Clinical Impression(s) / ED Diagnoses Final diagnoses:  Chest pain    Rx / DC Orders ED Discharge Orders     None        Linwood Dibbles, MD 01/12/21  1547 Second troponin is normal.  Patient is feeling well.  He thinks it may have been related to something he ate this morning.  Warning signs and precautions discussed.  He will follow-up with Dr. Katheran Awe, MD 01/12/21 [phone]

## 2021-01-12 NOTE — ED Provider Notes (Signed)
Emergency Medicine Provider Triage Evaluation Note  Eddie Lewis , a 60 y.o. male  was evaluated in triage.  Pt complains of cp.  Review of Systems  Positive: Cp, indigestion Negative: Fever, cough, sob  Physical Exam  BP (!) 125/96 (BP Location: Right Arm)   Pulse 80   Temp 98.7 F (37.1 C) (Oral)   Resp 16   SpO2 95%  Gen:   Awake, no distress   Resp:  Normal effort  MSK:   Moves extremities without difficulty  Other:    Medical Decision Making  Medically screening exam initiated at 12:27 PM.  Appropriate orders placed.  [name] was informed that the remainder of the evaluation will be completed by another provider, this initial triage assessment does not replace that evaluation, and the importance of remaining in the ED until their evaluation is complete.  Pt with significant cardiac hx here with CP while at work.  Sxs started while he was walking up some steps.  Pain has subside.     Fayrene Helper, PA-C 01/12/21 1230    Blane Ohara, MD 01/17/21 1346

## 2021-01-12 NOTE — ED Notes (Signed)
RN reviewed discharge instructions w/ pt. Follow up and medications reviewed, pt had no further questions °

## 2021-01-12 NOTE — ED Triage Notes (Signed)
Pt here POV with c/o of chest pain which began today at work. Pt states pain is 3/10 central chest which radiates to left. Sharp pain lasting 3-5 mins. History with heart attacks.

## 2021-02-12 ENCOUNTER — Encounter (HOSPITAL_COMMUNITY): Payer: Self-pay | Admitting: Radiology

## 2021-03-19 ENCOUNTER — Other Ambulatory Visit: Payer: Self-pay

## 2021-03-19 ENCOUNTER — Ambulatory Visit (INDEPENDENT_AMBULATORY_CARE_PROVIDER_SITE_OTHER): Payer: No Typology Code available for payment source | Admitting: Family

## 2021-03-19 ENCOUNTER — Encounter: Payer: Self-pay | Admitting: Family

## 2021-03-19 VITALS — BP 137/79 | HR 72 | Temp 98.0°F | Ht 63.0 in | Wt 202.6 lb

## 2021-03-19 DIAGNOSIS — N529 Male erectile dysfunction, unspecified: Secondary | ICD-10-CM

## 2021-03-19 DIAGNOSIS — M25552 Pain in left hip: Secondary | ICD-10-CM | POA: Insufficient documentation

## 2021-03-19 DIAGNOSIS — G8929 Other chronic pain: Secondary | ICD-10-CM | POA: Insufficient documentation

## 2021-03-19 DIAGNOSIS — Z01818 Encounter for other preprocedural examination: Secondary | ICD-10-CM

## 2021-03-19 HISTORY — DX: Encounter for other preprocedural examination: Z01.818

## 2021-03-19 HISTORY — DX: Male erectile dysfunction, unspecified: N52.9

## 2021-03-19 HISTORY — DX: Other chronic pain: G89.29

## 2021-03-19 LAB — POCT URINALYSIS DIPSTICK
Bilirubin, UA: NEGATIVE
Blood, UA: NEGATIVE
Glucose, UA: POSITIVE — AB
Ketones, UA: NEGATIVE
Leukocytes, UA: NEGATIVE
Nitrite, UA: NEGATIVE
Protein, UA: NEGATIVE
Spec Grav, UA: 1.025 (ref 1.010–1.025)
Urobilinogen, UA: 0.2 E.U./dL
pH, UA: 6 (ref 5.0–8.0)

## 2021-03-19 NOTE — Patient Instructions (Signed)
Welcome to [facility] at [place]! It was a pleasure meeting you today.    PLEASE NOTE:  If you had any LAB tests please let us know if you have not heard back within a few days. You may see your results on MyChart before we have a chance to review them but we will give you a call once they are reviewed by us. If we ordered any REFERRALS today, please let us know if you have not heard from their office within the next week.  Let us know through MyChart if you are needing REFILLS, or have your pharmacy send us the request. You can also use MyChart to communicate with me or any office staff.  Please try these tips to maintain a healthy lifestyle:  Eat most of your calories during the day when you are active. Eliminate processed foods including packaged sweets (pies, cakes, cookies), reduce intake of potatoes, white bread, white pasta, and white rice. Look for whole grain options, oat flour or almond flour.  Each meal should contain half fruits/vegetables, one quarter protein, and one quarter carbs (no bigger than a computer mouse).  Cut down on sweet beverages. This includes juice, soda, and sweet tea. Also watch fruit intake, though this is a healthier sweet option, it still contains natural sugar! Limit to 3 servings daily.  Drink at least 1 glass of water with each meal and aim for at least 8 glasses per day  Exercise at least 150 minutes every week.   

## 2021-03-19 NOTE — Assessment & Plan Note (Signed)
Advised his T2DM and heart meds can play a part. He has been on heart meds for at least 12years, ED just started worsening over the last year. Checking testosterone today, and will address again after his THR surgery.

## 2021-03-19 NOTE — Assessment & Plan Note (Signed)
Has upcoming THR scheduled. Pain relieved only minimally with Mobic.

## 2021-03-19 NOTE — Progress Notes (Signed)
New Patient Office Visit  Subjective:  Patient ID: Eddie Lewis, male    DOB: 1960-09-16  Age: 60 y.o. MRN: [id number]  CC:  Chief Complaint  Patient presents with   Establish Care   Pre-op Exam    For hip surgery. Surgery is not scheduled.    HPI Eddie Lewis presents for establishing care and to receive a pre-op exam for hip replacement surgery. Pain: He reports chronic left hip pain. There was not an injury that may have caused the pain. The pain started a few years ago and is gradually worsening. The pain does radiate down the leg. The pain is described as burning, sharp, soreness, stabbing, and stiffness, is 8/10 in intensity, occurring constantly. Symptoms are worse in the: all day. Aggravating factors: standing, walking, and walking uphill He has tried NSAIDs and prescription pain relievers with little relief.  Erectile Dysfunction: Patient complains of erectile dysfunction.  Onset of dysfunction was 3 years ago and was gradual in onset.  Patient states the nature of difficulty is both attaining and maintaining erection. Full erections occur never. Partial erections occur with intercourse. Libido is not affected. Risk factors for ED include cardiovascular disease, diabetes mellitus, and beta blocker use. Patient denies history of neurologic disease, urologic disease, and hypogonadism.  Patient's description of relationship w/partner strained.  Previous treatment of ED includes nothing.    Past Medical History:  Diagnosis Date   BMI 32.0-32.9,adult    CAD (coronary artery disease)    Kidney stones 2006   Myocardial infarction [facility])     Past Surgical History:  Procedure Laterality Date   CARDIAC CATHETERIZATION     CORONARY STENT PLACEMENT  11/28/11   2nd stent on 12/03/11   CORONARY/GRAFT ACUTE MI REVASCULARIZATION N/A 03/17/2018   Procedure: Coronary/Graft Acute MI Revascularization;  Surgeon: Marykay Lex, MD;  Location: [facility] INVASIVE CV LAB;  Service: Cardiovascular;   Laterality: N/A;   LEFT AND RIGHT HEART CATHETERIZATION WITH CORONARY ANGIOGRAM  11/29/2011   Procedure: LEFT AND RIGHT HEART CATHETERIZATION WITH CORONARY ANGIOGRAM;  Surgeon: Robynn Pane, MD;  Location: [facility] CATH LAB;  Service: Cardiovascular;;   LEFT HEART CATH AND CORONARY ANGIOGRAPHY N/A 03/17/2018   Procedure: LEFT HEART CATH AND CORONARY ANGIOGRAPHY;  Surgeon: Marykay Lex, MD;  Location: [facility] INVASIVE CV LAB;  Service: Cardiovascular;  Laterality: N/A;   LEFT HEART CATHETERIZATION WITH CORONARY ANGIOGRAM N/A 12/03/2011   Procedure: LEFT HEART CATHETERIZATION WITH CORONARY ANGIOGRAM;  Surgeon: Robynn Pane, MD;  Location: [facility] CATH LAB;  Service: Cardiovascular;  Laterality: N/A;   NO PAST SURGERIES     PERCUTANEOUS CORONARY STENT INTERVENTION (PCI-S)  11/29/2011   Procedure: PERCUTANEOUS CORONARY STENT INTERVENTION (PCI-S);  Surgeon: Robynn Pane, MD;  Location: [facility] CATH LAB;  Service: Cardiovascular;;    Family History  Problem Relation Age of Onset   Other Father     Social History   Socioeconomic History   Marital status: Married    Spouse name: Not on file   Number of children: Not on file   Years of education: Not on file   Highest education level: Not on file  Occupational History   Not on file  Tobacco Use   Smoking status: Every Day    Packs/day: 1.00    Years: 33.00    Pack years: 33.00    Types: Cigarettes    Last attempt to quit: 11/27/2011    Years since quitting: 9.3   Smokeless tobacco: Never  Substance  and Sexual Activity   Alcohol use: Yes   Drug use: No   Sexual activity: Yes  Other Topics Concern   Not on file  Social History Narrative   Not on file   Social Determinants of Health   Financial Resource Strain: Not on file  Food Insecurity: Not on file  Transportation Needs: Not on file  Physical Activity: Not on file  Stress: Not on file  Social Connections: Not on file  Intimate Partner Violence: Not on file    Objective:   Today's  Vitals: BP 137/79    Pulse 72    Temp 98 F (36.7 C) (Temporal)    Ht 5\' 3"  (1.6 m)    Wt 202 lb 9.6 oz (91.9 kg)    SpO2 94%    BMI 35.89 kg/m   Physical Exam Vitals and nursing note reviewed.  Constitutional:      General: He is not in acute distress.    Appearance: Normal appearance. He is obese.  HENT:     Head: Normocephalic.  Cardiovascular:     Rate and Rhythm: Normal rate and regular rhythm.  Pulmonary:     Effort: Pulmonary effort is normal.     Breath sounds: Normal breath sounds.  Musculoskeletal:     Cervical back: Normal range of motion.     Left hip: Decreased range of motion.  Skin:    General: Skin is warm and dry.  Neurological:     Mental Status: He is alert and oriented to person, place, and time.  Psychiatric:        Mood and Affect: Mood normal.    Assessment & Plan:   Problem List Items Addressed This Visit       Other   Pre-operative general physical examination - Primary    Pt has upcoming left hip replacement surgery for chronic pain. He is followed by Dr. for cardiology and has had a recent pre-op ECG thru him. Obtaining labs today, and will sign surgical form when reviewed.      Relevant Orders   Basic Metabolic Panel (BMET)   HgB A1c   POCT Urinalysis Dipstick (Completed)   Protime-INR   CBC with Differential/Platelet   Albumin   Chronic left hip pain    Has upcoming THR scheduled. Pain relieved only minimally with Mobic.      Erectile dysfunction    Advised his T2DM and heart meds can play a part. He has been on heart meds for at least 12years, ED just started worsening over the last year. Checking testosterone today, and will address again after his THR surgery.      Relevant Orders   Testosterone, Free, Total, SHBG    Outpatient Encounter Medications as of 03/19/2021  Medication Sig   aspirin EC 81 MG tablet Take 81 mg by mouth daily.   atorvastatin (LIPITOR) 80 MG tablet Take 1 tablet (80 mg total) by mouth daily with  breakfast.   FARXIGA 10 MG TABS tablet Take 10 mg by mouth daily.   losartan (COZAAR) 100 MG tablet Take 100 mg by mouth daily.   meloxicam (MOBIC) 7.5 MG tablet Take 7.5 mg by mouth daily.   metFORMIN (GLUCOPHAGE-XR) 500 MG 24 hr tablet Take 500 mg by mouth daily.   metoprolol succinate (TOPROL-XL) 25 MG 24 hr tablet Take 25 mg by mouth daily.   nitroGLYCERIN (NITROSTAT) 0.4 MG SL tablet Place 1 tablet (0.4 mg total) under the tongue every 5 (five) minutes x 3  doses as needed for chest pain.   [DISCONTINUED] famotidine (PEPCID) 20 MG tablet Take 1 tablet (20 mg total) by mouth 2 (two) times daily. (Patient not taking: Reported on 01/12/2021)   [DISCONTINUED] losartan (COZAAR) 25 MG tablet Take 1 tablet (25 mg total) by mouth daily. (Patient not taking: Reported on 03/19/2021)   [DISCONTINUED] orphenadrine (NORFLEX) 100 MG tablet Take 1 tablet (100 mg total) by mouth 2 (two) times daily. (Patient not taking: Reported on 01/12/2021)   [DISCONTINUED] polyethylene glycol powder (GLYCOLAX/MIRALAX) powder Take 17 g by mouth daily. (Patient not taking: Reported on 03/17/2018)   [DISCONTINUED] predniSONE (STERAPRED UNI-PAK 21 TAB) 10 MG (21) TBPK tablet Take by mouth daily. Take 6 tabs by mouth daily  for 2 days, then 5 tabs for 2 days, then 4 tabs for 2 days, then 3 tabs for 2 days, 2 tabs for 2 days, then 1 tab by mouth daily for 2 days (Patient not taking: Reported on 01/12/2021)   [DISCONTINUED] ticagrelor (BRILINTA) 90 MG TABS tablet Take 1 tablet (90 mg total) by mouth 2 (two) times daily. (Patient not taking: Reported on 01/12/2021)   No facility-administered encounter medications on file as of 03/19/2021.    Follow-up: Return for as needed for future concerns.   Dulce Sellar, NP

## 2021-03-19 NOTE — Assessment & Plan Note (Addendum)
Pt has upcoming left hip replacement surgery for chronic pain. He is followed by Dr. Sharyn Lull for cardiology and has had a recent pre-op ECG thru him. Obtaining labs today, and will sign surgical form when reviewed.

## 2021-03-20 LAB — CBC WITH DIFFERENTIAL/PLATELET
Basophils Absolute: 0.1 10*3/uL (ref 0.0–0.1)
Basophils Relative: 0.7 % (ref 0.0–3.0)
Eosinophils Absolute: 0.2 10*3/uL (ref 0.0–0.7)
Eosinophils Relative: 2.4 % (ref 0.0–5.0)
HCT: 42.4 % (ref 39.0–52.0)
Hemoglobin: 13.7 g/dL (ref 13.0–17.0)
Lymphocytes Relative: 21.3 % (ref 12.0–46.0)
Lymphs Abs: 1.7 10*3/uL (ref 0.7–4.0)
MCHC: 32.3 g/dL (ref 30.0–36.0)
MCV: 87.6 fl (ref 78.0–100.0)
Monocytes Absolute: 0.6 10*3/uL (ref 0.1–1.0)
Monocytes Relative: 7.7 % (ref 3.0–12.0)
Neutro Abs: 5.3 10*3/uL (ref 1.4–7.7)
Neutrophils Relative %: 67.9 % (ref 43.0–77.0)
Platelets: 208 10*3/uL (ref 150.0–400.0)
RBC: 4.84 Mil/uL (ref 4.22–5.81)
RDW: 15.2 % (ref 11.5–15.5)
WBC: 7.8 10*3/uL (ref 4.0–10.5)

## 2021-03-20 LAB — ALBUMIN: Albumin: 4.5 g/dL (ref 3.5–5.2)

## 2021-03-20 LAB — HEMOGLOBIN A1C: Hgb A1c MFr Bld: 7.5 % — ABNORMAL HIGH (ref 4.6–6.5)

## 2021-03-20 LAB — PROTIME-INR
INR: 1 ratio (ref 0.8–1.0)
Prothrombin Time: 11.3 s (ref 9.6–13.1)

## 2021-03-20 LAB — BASIC METABOLIC PANEL
BUN: 15 mg/dL (ref 6–23)
CO2: 33 mEq/L — ABNORMAL HIGH (ref 19–32)
Calcium: 9.6 mg/dL (ref 8.4–10.5)
Chloride: 98 mEq/L (ref 96–112)
Creatinine, Ser: 0.91 mg/dL (ref 0.40–1.50)
GFR: 92.56 mL/min (ref >60.00)
Glucose, Bld: 119 mg/dL — ABNORMAL HIGH (ref 70–99)
Potassium: 3.7 mEq/L (ref 3.5–5.1)
Sodium: 139 mEq/L (ref 135–145)

## 2021-03-23 ENCOUNTER — Other Ambulatory Visit: Payer: Self-pay | Admitting: Family

## 2021-03-23 ENCOUNTER — Other Ambulatory Visit: Payer: Self-pay

## 2021-03-23 MED ORDER — METFORMIN HCL ER 500 MG PO TB24: 500.0000 mg | ORAL_TABLET | Freq: Two times a day (BID) | ORAL | 0 refills | Status: AC

## 2021-03-23 NOTE — Progress Notes (Signed)
testosterone level is low. As discussed during visit we can discuss replacement via injections or skin gel after his surgery to help with his ED.

## 2021-03-27 ENCOUNTER — Ambulatory Visit: Payer: Self-pay | Admitting: Student

## 2021-03-27 NOTE — Progress Notes (Signed)
Surgery orders requested via Epic inbox. °

## 2021-03-28 LAB — SPECIMEN STATUS REPORT

## 2021-03-31 LAB — TESTOSTERONE, FREE, TOTAL, SHBG
Testosterone, Free: 4.6 pg/mL — ABNORMAL LOW (ref 7.2–24.0)
Testosterone: 204 ng/dL — ABNORMAL LOW (ref 264–916)

## 2021-04-06 NOTE — Progress Notes (Addendum)
COVID swab appointment: 04/16/21 @ 0900  COVID Vaccine Completed: no Date COVID Vaccine completed: Has received booster: COVID vaccine manufacturer: [employer]   Date of COVID positive in last 90 days: no  PCP - Dulce Sellar, NP Cardiologist - Rinaldo Cloud  Medical clearance by Dulce Sellar 03/20/21 in on chart Cardiac clearance Mohan Harwani11/22/22 on chart  Chest x-ray - 01/12/21 Epic EKG - 02/20/21 on chart Stress Test - 03/05/14 Epic ECHO - 03/18/18 Epic Cardiac Cath - 03/17/18 Epic Pacemaker/ICD device last checked: n/a Spinal Cord Stimulator: n/a  Sleep Study -  n/a CPAP -   Fasting Blood Sugar - hasn't checked in  Checks Blood Sugar _____ times a day  Blood Thinner Instructions: Aspirin Instructions: ASA 81, hold 5 days Last Dose:  Activity level: Can go up a flight of stairs and perform activities of daily living without stopping and without symptoms of chest pain or shortness of breath.    Anesthesia review: DM2, MI, HTN, CAD, STOP BANG 6  Patient denies shortness of breath, fever, cough and chest pain at PAT appointment   Patient verbalized understanding of instructions that were given to them at the PAT appointment. Patient was also instructed that they will need to review over the PAT instructions again at home before surgery.

## 2021-04-06 NOTE — Patient Instructions (Addendum)
DUE TO COVID-19 ONLY ONE VISITOR IS ALLOWED TO COME WITH YOU AND STAY IN THE WAITING ROOM ONLY DURING PRE OP AND PROCEDURE.   **NO VISITORS ARE ALLOWED IN THE SHORT STAY AREA OR RECOVERY ROOM!!**  IF YOU WILL BE ADMITTED INTO THE HOSPITAL YOU ARE ALLOWED ONLY TWO SUPPORT PEOPLE DURING VISITATION HOURS ONLY (7 AM -8PM)   The support person(s) must pass our screening, gel in and out, and wear a mask at all times, including in the patients room. Patients must also wear a mask when staff or their support person are in the room. Visitors GUEST BADGE MUST BE WORN VISIBLY  One adult visitor may remain with you overnight and MUST be in the room by 8 P.M.  No visitors under the age of 71. Any visitor under the age of 27 must be accompanied by an adult.    COVID SWAB TESTING MUST BE COMPLETED ON:  04/16/21 @ 9:00 AM   Site: [facility] [address] [place] Enter: Main Entrance have a seat in the waiting area to the right of main entrance (DO NOT CHECK IN AT ADMITTING!!!!!) Dial: [phone] to alert staff you have arrived  You are not required to quarantine, however you are required to wear a well-fitted mask when you are out and around people not in your household.  Hand Hygiene often Do NOT share personal items Notify your provider if you are in close contact with someone who has COVID or you develop fever 100.4 or greater, new onset of sneezing, cough, sore throat, shortness of breath or body aches.       Your procedure is scheduled on: 04/18/21   Report to [facility] Main Entrance    Report to admitting at 8:45 AM   Call this number if you have problems the morning of surgery [phone]   Do not eat food :After Midnight.   May have liquids until 8:30 AM day of surgery  CLEAR LIQUID DIET  Foods Allowed                                                                     Foods Excluded  Water, Black Coffee and tea (no milk or creamer)           liquids  that you cannot  Plain Jell-O in any flavor  (No red)                                   see through such as: Fruit ices (not with fruit pulp)                                           milk, soups, orange juice              Iced Popsicles (No red)  All solid food                                   Apple juices Sports drinks like Gatorade (No red) Lightly seasoned clear broth or consume(fat free) Sugar     The day of surgery:  Drink ONE (1) Pre-Surgery G2 by 8:30 am the morning of surgery. Drink in one sitting. Do not sip.  This drink was given to you during your hospital  pre-op appointment visit. Nothing else to drink after completing the  Pre-Surgery G2.          If you have questions, please contact your surgeons office.     Oral Hygiene is also important to reduce your risk of infection.                                    Remember - BRUSH YOUR TEETH THE MORNING OF SURGERY WITH YOUR REGULAR TOOTHPASTE    Take these medicines the morning of surgery with A SIP OF WATER: Tylenol, Lipitor, Metoprolol   DO NOT TAKE ANY ORAL DIABETIC MEDICATIONS DAY OF YOUR SURGERY  How to Manage Your Diabetes Before and After Surgery  Why is it important to control my blood sugar before and after surgery? Improving blood sugar levels before and after surgery helps healing and can limit problems. A way of improving blood sugar control is eating a healthy diet by:  Eating less sugar and carbohydrates  Increasing activity/exercise  Talking with your doctor about reaching your blood sugar goals High blood sugars (greater than 180 mg/dL) can raise your risk of infections and slow your recovery, so you will need to focus on controlling your diabetes during the weeks before surgery. Make sure that the doctor who takes care of your diabetes knows about your planned surgery including the date and location.  How do I manage my blood sugar before surgery? Check  your blood sugar at least 4 times a day, starting 2 days before surgery, to make sure that the level is not too high or low. Check your blood sugar the morning of your surgery when you wake up and every 2 hours until you get to the Short Stay unit. If your blood sugar is less than 70 mg/dL, you will need to treat for low blood sugar: Do not take insulin. Treat a low blood sugar (less than 70 mg/dL) with  cup of clear juice (cranberry or apple), 4 glucose tablets, OR glucose gel. Recheck blood sugar in 15 minutes after treatment (to make sure it is greater than 70 mg/dL). If your blood sugar is not greater than 70 mg/dL on recheck, call [phone] for further instructions. Report your blood sugar to the short stay nurse when you get to Short Stay.  If you are admitted to the hospital after surgery: Your blood sugar will be checked by the staff and you will probably be given insulin after surgery (instead of oral diabetes medicines) to make sure you have good blood sugar levels. The goal for blood sugar control after surgery is 80-180 mg/dL.   WHAT DO I DO ABOUT MY DIABETES MEDICATION?  Do not take oral diabetes medicines (pills) the morning of surgery.  THE DAY BEFORE SURGERY, take Metformin as prescribed the day before surgery. Do not take ComorosFarxiga.        THE MORNING  OF SURGERY, do not take Metformin or Marcelline DeistFarxiga  Reviewed and Endorsed by [facility] Patient Education Committee, August 2015                               You may not have any metal on your body including jewelry, and body piercing             Do not wear lotions, powders, cologne, or deodorant              Men may shave face and neck.   Do not bring valuables to the hospital. [facility] IS NOT             RESPONSIBLE   FOR VALUABLES.   Contacts, dentures or bridgework may not be worn into surgery.   Bring small overnight bag day of surgery.              Please read over the following fact sheets you were given:  IF YOU HAVE QUESTIONS ABOUT YOUR PRE-OP INSTRUCTIONS PLEASE CALL [phone]- Eddie Lewis     [facility] - Preparing for Surgery Before surgery, you can play an important role.  Because skin is not sterile, your skin needs to be as free of germs as possible.  You can reduce the number of germs on your skin by washing with CHG (chlorahexidine gluconate) soap before surgery.  CHG is an antiseptic cleaner which kills germs and bonds with the skin to continue killing germs even after washing. Please DO NOT use if you have an allergy to CHG or antibacterial soaps.  If your skin becomes reddened/irritated stop using the CHG and inform your nurse when you arrive at Short Stay. Do not shave (including legs and underarms) for at least 48 hours prior to the first CHG shower.  You may shave your face/neck.  Please follow these instructions carefully:  1.  Shower with CHG Soap the night before surgery and the  morning of surgery.  2.  If you choose to wash your hair, wash your hair first as usual with your normal  shampoo.  3.  After you shampoo, rinse your hair and body thoroughly to remove the shampoo.                             4.  Use CHG as you would any other liquid soap.  You can apply chg directly to the skin and wash.  Gently with a scrungie or clean washcloth.  5.  Apply the CHG Soap to your body ONLY FROM THE NECK DOWN.   Do   not use on face/ open                           Wound or open sores. Avoid contact with eyes, ears mouth and   genitals (private parts).                       Wash face,  Genitals (private parts) with your normal soap.             6.  Wash thoroughly, paying special attention to the area where your    surgery  will be performed.  7.  Thoroughly rinse your body with warm water from the neck down.  8.  DO NOT shower/wash with your normal soap after using and  rinsing off the CHG Soap.                9.  Pat yourself dry with a clean towel.            10.  Wear clean pajamas.             11.  Place clean sheets on your bed the night of your first shower and do not  sleep with pets. Day of Surgery : Do not apply any lotions/deodorants the morning of surgery.  Please wear clean clothes to the hospital/surgery center.  FAILURE TO FOLLOW THESE INSTRUCTIONS MAY RESULT IN THE CANCELLATION OF YOUR SURGERY  PATIENT SIGNATURE_________________________________  NURSE SIGNATURE__________________________________  ________________________________________________________________________   Eddie Lewis  An incentive spirometer is a tool that can help keep your lungs clear and active. This tool measures how well you are filling your lungs with each breath. Taking long deep breaths may help reverse or decrease the chance of developing breathing (pulmonary) problems (especially infection) following: A long period of time when you are unable to move or be active. BEFORE THE PROCEDURE  If the spirometer includes an indicator to show your best effort, your nurse or respiratory therapist will set it to a desired goal. If possible, sit up straight or lean slightly forward. Try not to slouch. Hold the incentive spirometer in an upright position. INSTRUCTIONS FOR USE  Sit on the edge of your bed if possible, or sit up as far as you can in bed or on a chair. Hold the incentive spirometer in an upright position. Breathe out normally. Place the mouthpiece in your mouth and seal your lips tightly around it. Breathe in slowly and as deeply as possible, raising the piston or the ball toward the top of the column. Hold your breath for 3-5 seconds or for as long as possible. Allow the piston or ball to fall to the bottom of the column. Remove the mouthpiece from your mouth and breathe out normally. Rest for a few seconds and repeat Steps 1 through 7 at least 10 times every 1-2 hours when you are awake. Take your time and take a few normal breaths between deep breaths. The spirometer may  include an indicator to show your best effort. Use the indicator as a goal to work toward during each repetition. After each set of 10 deep breaths, practice coughing to be sure your lungs are clear. If you have an incision (the cut made at the time of surgery), support your incision when coughing by placing a pillow or rolled up towels firmly against it. Once you are able to get out of bed, walk around indoors and cough well. You may stop using the incentive spirometer when instructed by your caregiver.  RISKS AND COMPLICATIONS Take your time so you do not get dizzy or light-headed. If you are in pain, you may need to take or ask for pain medication before doing incentive spirometry. It is harder to take a deep breath if you are having pain. AFTER USE Rest and breathe slowly and easily. It can be helpful to keep track of a log of your progress. Your caregiver can provide you with a simple table to help with this. If you are using the spirometer at home, follow these instructions: SEEK MEDICAL CARE IF:  You are having difficultly using the spirometer. You have trouble using the spirometer as often as instructed. Your pain medication is not giving enough relief while using the spirometer. You develop  fever of 100.5 F (38.1 C) or higher. SEEK IMMEDIATE MEDICAL CARE IF:  You cough up bloody sputum that had not been present before. You develop fever of 102 F (38.9 C) or greater. You develop worsening pain at or near the incision site. MAKE SURE YOU:  Understand these instructions. Will watch your condition. Will get help right away if you are not doing well or get worse. Document Released: 07/29/2006 Document Revised: 06/10/2011 Document Reviewed: 09/29/2006 [facility] Patient Information 2014 [place], [place].   ________________________________________________________________________

## 2021-04-09 ENCOUNTER — Encounter (HOSPITAL_COMMUNITY)
Admission: RE | Admit: 2021-04-09 | Discharge: 2021-04-09 | Disposition: A | Discharge status: Home / Self Care | Payer: No Typology Code available for payment source | Source: Ambulatory Visit | Attending: Orthopedic Surgery | Admitting: Orthopedic Surgery

## 2021-04-09 ENCOUNTER — Other Ambulatory Visit: Payer: Self-pay

## 2021-04-09 ENCOUNTER — Ambulatory Visit: Payer: Self-pay | Admitting: Student

## 2021-04-09 ENCOUNTER — Encounter (HOSPITAL_COMMUNITY): Payer: Self-pay

## 2021-04-09 VITALS — BP 132/90 | HR 100 | Temp 98.8°F | Resp 16 | Ht 63.5 in | Wt 200.8 lb

## 2021-04-09 DIAGNOSIS — F17210 Nicotine dependence, cigarettes, uncomplicated: Secondary | ICD-10-CM | POA: Insufficient documentation

## 2021-04-09 DIAGNOSIS — I2510 Atherosclerotic heart disease of native coronary artery without angina pectoris: Secondary | ICD-10-CM | POA: Diagnosis not present

## 2021-04-09 DIAGNOSIS — Z01818 Encounter for other preprocedural examination: Secondary | ICD-10-CM

## 2021-04-09 DIAGNOSIS — E119 Type 2 diabetes mellitus without complications: Secondary | ICD-10-CM | POA: Insufficient documentation

## 2021-04-09 DIAGNOSIS — I10 Essential (primary) hypertension: Secondary | ICD-10-CM | POA: Insufficient documentation

## 2021-04-09 DIAGNOSIS — M1612 Unilateral primary osteoarthritis, left hip: Secondary | ICD-10-CM | POA: Diagnosis not present

## 2021-04-09 DIAGNOSIS — Z01812 Encounter for preprocedural laboratory examination: Secondary | ICD-10-CM | POA: Diagnosis present

## 2021-04-09 HISTORY — DX: Unspecified osteoarthritis, unspecified site: M19.90

## 2021-04-09 HISTORY — DX: Pneumonia, unspecified organism: J18.9

## 2021-04-09 HISTORY — DX: Essential (primary) hypertension: I10

## 2021-04-09 HISTORY — DX: Type 2 diabetes mellitus without complications: E11.9

## 2021-04-09 LAB — CBC
HCT: 45.9 % (ref 39.0–52.0)
Hemoglobin: 14.7 g/dL (ref 13.0–17.0)
MCH: 28.4 pg (ref 26.0–34.0)
MCHC: 32 g/dL (ref 30.0–36.0)
MCV: 88.8 fL (ref 80.0–100.0)
Platelets: 220 10*3/uL (ref 150–400)
RBC: 5.17 MIL/uL (ref 4.22–5.81)
RDW: 13.5 % (ref 11.5–15.5)
WBC: 9 10*3/uL (ref 4.0–10.5)
nRBC: 0 % (ref 0.0–0.2)

## 2021-04-09 LAB — SURGICAL PCR SCREEN
MRSA, PCR: NEGATIVE
Staphylococcus aureus: NEGATIVE

## 2021-04-09 LAB — BASIC METABOLIC PANEL
Anion gap: 10 (ref 5–15)
BUN: 20 mg/dL (ref 6–20)
CO2: 27 mmol/L (ref 22–32)
Calcium: 9.3 mg/dL (ref 8.9–10.3)
Chloride: 102 mmol/L (ref 98–111)
Creatinine, Ser: 0.94 mg/dL (ref 0.61–1.24)
GFR, Estimated: >60 mL/min (ref >60)
Glucose, Bld: 214 mg/dL — ABNORMAL HIGH (ref 70–99)
Potassium: 4.5 mmol/L (ref 3.5–5.1)
Sodium: 139 mmol/L (ref 135–145)

## 2021-04-09 LAB — HEMOGLOBIN A1C
Hgb A1c MFr Bld: 6.8 % — ABNORMAL HIGH (ref 4.8–5.6)
Mean Plasma Glucose: 148.46 mg/dL

## 2021-04-09 LAB — GLUCOSE, CAPILLARY: Glucose-Capillary: 261 mg/dL — ABNORMAL HIGH (ref 70–99)

## 2021-04-09 NOTE — H&P (Signed)
TOTAL HIP ADMISSION H&P  Patient is admitted for left total hip arthroplasty.  Subjective:  Chief Complaint: left hip pain  HPI: Eddie Lewis, 61 y.o. male, has a history of pain and functional disability in the left hip(s) due to arthritis and patient has failed non-surgical conservative treatments for greater than 12 weeks to include NSAID's and/or analgesics and activity modification.  Onset of symptoms was gradual starting 3 years ago with gradually worsening course since that time.The patient noted no past surgery on the left hip(s).  Patient currently rates pain in the left hip at 8 out of 10 with activity. Patient has worsening of pain with activity and weight bearing, pain that interfers with activities of daily living, and pain with passive range of motion. Patient has evidence of subchondral cysts, subchondral sclerosis, and joint space narrowing by imaging studies. This condition presents safety issues increasing the risk of falls.   There is no current active infection.  Patient Active Problem List   Diagnosis Date Noted   Pre-operative general physical examination 03/19/2021   Chronic left hip pain 03/19/2021   Erectile dysfunction 03/19/2021   CAD (coronary artery disease)    BMI 32.0-32.9,adult    STEMI (ST elevation myocardial infarction) ([place]) 03/18/2018   Coronary artery disease involving native coronary artery of native heart with unstable angina pectoris ([place]) 03/17/2018   Acute ST elevation myocardial infarction (STEMI) of inferolateral wall ([place]) 03/17/2018   Acute combined systolic and diastolic heart failure ([place]) - DHF noted, EF not known 03/17/2018   Essential hypertension 03/17/2018   Hyperlipidemia with target LDL less than 70 03/17/2018   Chest pain 03/04/2014   Unstable angina ([place]) 03/04/2014   Past Medical History:  Diagnosis Date   Arthritis    BMI 32.0-32.9,adult    CAD (coronary artery disease)    Diabetes mellitus without complication ([place])     Hypertension    Kidney stones 2006   Myocardial infarction [facility])    Pneumonia     Past Surgical History:  Procedure Laterality Date   CARDIAC CATHETERIZATION     CORONARY STENT PLACEMENT  11/28/2011   2nd stent on 12/03/11   CORONARY/GRAFT ACUTE MI REVASCULARIZATION N/A 03/17/2018   Procedure: Coronary/Graft Acute MI Revascularization;  Surgeon: Leonie Man, MD;  Location: [place] CV LAB;  Service: Cardiovascular;  Laterality: N/A;   LEFT AND RIGHT HEART CATHETERIZATION WITH CORONARY ANGIOGRAM  11/29/2011   Procedure: LEFT AND RIGHT HEART CATHETERIZATION WITH CORONARY ANGIOGRAM;  Surgeon: Clent Demark, MD;  Location: [place] CATH LAB;  Service: Cardiovascular;;   LEFT HEART CATH AND CORONARY ANGIOGRAPHY N/A 03/17/2018   Procedure: LEFT HEART CATH AND CORONARY ANGIOGRAPHY;  Surgeon: Leonie Man, MD;  Location: [place] CV LAB;  Service: Cardiovascular;  Laterality: N/A;   LEFT HEART CATHETERIZATION WITH CORONARY ANGIOGRAM N/A 12/03/2011   Procedure: LEFT HEART CATHETERIZATION WITH CORONARY ANGIOGRAM;  Surgeon: Clent Demark, MD;  Location: [place] CATH LAB;  Service: Cardiovascular;  Laterality: N/A;   NO PAST SURGERIES     PERCUTANEOUS CORONARY STENT INTERVENTION (PCI-S)  11/29/2011   Procedure: PERCUTANEOUS CORONARY STENT INTERVENTION (PCI-S);  Surgeon: Clent Demark, MD;  Location: [facility] CATH LAB;  Service: Cardiovascular;;   TRACHEOSTOMY      Current Outpatient Medications  Medication Sig Dispense Refill Last Dose   acetaminophen (TYLENOL) 650 MG CR tablet Take 1,300 mg by mouth 2 (two) times daily.      aspirin EC 81 MG tablet Take 81 mg by mouth  daily.      atorvastatin (LIPITOR) 80 MG tablet Take 1 tablet (80 mg total) by mouth daily with breakfast. 30 tablet 3    FARXIGA 10 MG TABS tablet Take 10 mg by mouth daily.      losartan (COZAAR) 100 MG tablet Take 100 mg by mouth daily.      meloxicam (MOBIC) 7.5 MG tablet Take 7.5 mg by mouth daily.      metFORMIN  (GLUCOPHAGE-XR) 500 MG 24 hr tablet Take 1 tablet (500 mg total) by mouth in the morning and at bedtime. 180 tablet 0    metoprolol succinate (TOPROL-XL) 25 MG 24 hr tablet Take 25 mg by mouth daily.      nitroGLYCERIN (NITROSTAT) 0.4 MG SL tablet Place 1 tablet (0.4 mg total) under the tongue every 5 (five) minutes x 3 doses as needed for chest pain. 25 tablet 3    No current facility-administered medications for this visit.   Allergies  Allergen Reactions   Codeine Nausea And Vomiting    Social History   Tobacco Use   Smoking status: Every Day    Packs/day: 1.00    Years: 33.00    Pack years: 33.00    Types: Cigarettes    Last attempt to quit: 11/27/2011    Years since quitting: 9.3   Smokeless tobacco: Never  Substance Use Topics   Alcohol use: Yes    Alcohol/week: 1.0 standard drink    Types: 1 Cans of beer per week    Comment: occ    Family History  Problem Relation Age of Onset   Other Father      Review of Systems  Musculoskeletal:  Positive for arthralgias.  All other systems reviewed and are negative.  Objective:  Physical Exam HENT:     Head: Normocephalic.  Eyes:     Pupils: Pupils are equal, round, and reactive to light.  Cardiovascular:     Rate and Rhythm: Normal rate.  Pulmonary:     Effort: Pulmonary effort is normal.  Abdominal:     General: Abdomen is flat.  Genitourinary:    Comments: Deferred Musculoskeletal:     Cervical back: Normal range of motion.     Comments: Painful ROM L hip  Skin:    General: Skin is warm.  Neurological:     Mental Status: He is alert and oriented to person, place, and time.  Psychiatric:        Behavior: Behavior normal.    Vital signs in last 24 hours: @VSRANGES @  Labs:   Estimated body mass index is 35.02 kg/m as calculated from the following:   Height as of an earlier encounter on 04/09/21: 5' 3.5" (1.613 m).   Weight as of an earlier encounter on 04/09/21: 91.1 kg.   Imaging Review Plain  radiographs demonstrate severe degenerative joint disease of the left hip(s). The bone quality appears to be adequate for age and reported activity level.      Assessment/Plan:  End stage arthritis, left hip(s)  The patient history, physical examination, clinical judgement of the provider and imaging studies are consistent with end stage degenerative joint disease of the left hip(s) and total hip arthroplasty is deemed medically necessary. The treatment options including medical management, injection therapy, arthroscopy and arthroplasty were discussed at length. The risks and benefits of total hip arthroplasty were presented and reviewed. The risks due to aseptic loosening, infection, stiffness, dislocation/subluxation,  thromboembolic complications and other imponderables were discussed.  The patient acknowledged the  explanation, agreed to proceed with the plan and consent was signed. Patient is being admitted for inpatient treatment for surgery, pain control, PT, OT, prophylactic antibiotics, VTE prophylaxis, progressive ambulation and ADL's and discharge planning.The patient is planning to be discharged  home after overnight observation

## 2021-04-09 NOTE — H&P (View-Only) (Signed)
TOTAL HIP ADMISSION H&P ° °Patient is admitted for left total hip arthroplasty. ° °Subjective: ° °Chief Complaint: left hip pain ° °HPI: Eddie Lewis, 60 y.o. male, has a history of pain and functional disability in the left hip(s) due to arthritis and patient has failed non-surgical conservative treatments for greater than 12 weeks to include NSAID's and/or analgesics and activity modification.  Onset of symptoms was gradual starting 3 years ago with gradually worsening course since that time.The patient noted no past surgery on the left hip(s).  Patient currently rates pain in the left hip at 8 out of 10 with activity. Patient has worsening of pain with activity and weight bearing, pain that interfers with activities of daily living, and pain with passive range of motion. Patient has evidence of subchondral cysts, subchondral sclerosis, and joint space narrowing by imaging studies. This condition presents safety issues increasing the risk of falls.   There is no current active infection. ° °Patient Active Problem List  ° Diagnosis Date Noted  ° Pre-operative general physical examination 03/19/2021  ° Chronic left hip pain 03/19/2021  ° Erectile dysfunction 03/19/2021  ° CAD (coronary artery disease)   ° BMI 32.0-32.9,adult   ° STEMI (ST elevation myocardial infarction) (HCC) 03/18/2018  ° Coronary artery disease involving native coronary artery of native heart with unstable angina pectoris (HCC) 03/17/2018  ° Acute ST elevation myocardial infarction (STEMI) of inferolateral wall (HCC) 03/17/2018  ° Acute combined systolic and diastolic heart failure (HCC) - DHF noted, EF not known 03/17/2018  ° Essential hypertension 03/17/2018  ° Hyperlipidemia with target LDL less than 70 03/17/2018  ° Chest pain 03/04/2014  ° Unstable angina (HCC) 03/04/2014  ° °Past Medical History:  °Diagnosis Date  ° Arthritis   ° BMI 32.0-32.9,adult   ° CAD (coronary artery disease)   ° Diabetes mellitus without complication (HCC)   °  Hypertension   ° Kidney stones 2006  ° Myocardial infarction (HCC)   ° Pneumonia   °  °Past Surgical History:  °Procedure Laterality Date  ° CARDIAC CATHETERIZATION    ° CORONARY STENT PLACEMENT  11/28/2011  ° 2nd stent on 12/03/11  ° CORONARY/GRAFT ACUTE MI REVASCULARIZATION N/A 03/17/2018  ° Procedure: Coronary/Graft Acute MI Revascularization;  Surgeon: Harding, Noah W, MD;  Location: [facility];  Service: Cardiovascular;  Laterality: N/A;  ° LEFT AND RIGHT HEART CATHETERIZATION WITH CORONARY ANGIOGRAM  11/29/2011  ° Procedure: LEFT AND RIGHT HEART CATHETERIZATION WITH CORONARY ANGIOGRAM;  Surgeon: Mohan N Harwani, MD;  Location: [facility];  Service: Cardiovascular;;  ° LEFT HEART CATH AND CORONARY ANGIOGRAPHY N/A 03/17/2018  ° Procedure: LEFT HEART CATH AND CORONARY ANGIOGRAPHY;  Surgeon: Harding, Naasir W, MD;  Location: [facility];  Service: Cardiovascular;  Laterality: N/A;  ° LEFT HEART CATHETERIZATION WITH CORONARY ANGIOGRAM N/A 12/03/2011  ° Procedure: LEFT HEART CATHETERIZATION WITH CORONARY ANGIOGRAM;  Surgeon: Mohan N Harwani, MD;  Location: [facility];  Service: Cardiovascular;  Laterality: N/A;  ° NO PAST SURGERIES    ° PERCUTANEOUS CORONARY STENT INTERVENTION (PCI-S)  11/29/2011  ° Procedure: PERCUTANEOUS CORONARY STENT INTERVENTION (PCI-S);  Surgeon: Mohan N Harwani, MD;  Location: [facility];  Service: Cardiovascular;;  ° TRACHEOSTOMY    °  °Current Outpatient Medications  °Medication Sig Dispense Refill Last Dose  ° acetaminophen (TYLENOL) 650 MG CR tablet Take 1,300 mg by mouth 2 (two) times daily.     ° aspirin EC 81 MG tablet Take 81 mg by mouth   daily.     ° atorvastatin (LIPITOR) 80 MG tablet Take 1 tablet (80 mg total) by mouth daily with breakfast. 30 tablet 3   ° FARXIGA 10 MG TABS tablet Take 10 mg by mouth daily.     ° losartan (COZAAR) 100 MG tablet Take 100 mg by mouth daily.     ° meloxicam (MOBIC) 7.5 MG tablet Take 7.5 mg by mouth daily.     ° metFORMIN  (GLUCOPHAGE-XR) 500 MG 24 hr tablet Take 1 tablet (500 mg total) by mouth in the morning and at bedtime. 180 tablet 0   ° metoprolol succinate (TOPROL-XL) 25 MG 24 hr tablet Take 25 mg by mouth daily.     ° nitroGLYCERIN (NITROSTAT) 0.4 MG SL tablet Place 1 tablet (0.4 mg total) under the tongue every 5 (five) minutes x 3 doses as needed for chest pain. 25 tablet 3   ° °No current facility-administered medications for this visit.  ° °Allergies  °Allergen Reactions  ° Codeine Nausea And Vomiting  °  °Social History  ° °Tobacco Use  ° Smoking status: Every Day  °  Packs/day: 1.00  °  Years: 33.00  °  Pack years: 33.00  °  Types: Cigarettes  °  Last attempt to quit: 11/27/2011  °  Years since quitting: 9.3  ° Smokeless tobacco: Never  °Substance Use Topics  ° Alcohol use: Yes  °  Alcohol/week: 1.0 standard drink  °  Types: 1 Cans of beer per week  °  Comment: occ  °  °Family History  °Problem Relation Age of Onset  ° Other Father   °  ° °Review of Systems  °Musculoskeletal:  Positive for arthralgias.  °All other systems reviewed and are negative. ° °Objective: ° °Physical Exam °HENT:  °   Head: Normocephalic.  °Eyes:  °   Pupils: Pupils are equal, round, and reactive to light.  °Cardiovascular:  °   Rate and Rhythm: Normal rate.  °Pulmonary:  °   Effort: Pulmonary effort is normal.  °Abdominal:  °   General: Abdomen is flat.  °Genitourinary: °   Comments: Deferred °Musculoskeletal:  °   Cervical back: Normal range of motion.  °   Comments: Painful ROM L hip  °Skin: °   General: Skin is warm.  °Neurological:  °   Mental Status: He is alert and oriented to person, place, and time.  °Psychiatric:     °   Behavior: Behavior normal.  ° ° °Vital signs in last 24 hours: °@VSRANGES@ ° °Labs: ° ° °Estimated body mass index is 35.02 kg/m² as calculated from the following: °  Height as of an earlier encounter on 04/09/21: 5' 3.5" (1.613 m). °  Weight as of an earlier encounter on 04/09/21: 91.1 kg. ° ° °Imaging Review °Plain  radiographs demonstrate severe degenerative joint disease of the left hip(s). The bone quality appears to be adequate for age and reported activity level. ° ° ° ° ° °Assessment/Plan: ° °End stage arthritis, left hip(s) ° °The patient history, physical examination, clinical judgement of the provider and imaging studies are consistent with end stage degenerative joint disease of the left hip(s) and total hip arthroplasty is deemed medically necessary. The treatment options including medical management, injection therapy, arthroscopy and arthroplasty were discussed at length. The risks and benefits of total hip arthroplasty were presented and reviewed. The risks due to aseptic loosening, infection, stiffness, dislocation/subluxation,  thromboembolic complications and other imponderables were discussed.  The patient acknowledged the   explanation, agreed to proceed with the plan and consent was signed. Patient is being admitted for inpatient treatment for surgery, pain control, PT, OT, prophylactic antibiotics, VTE prophylaxis, progressive ambulation and ADL's and discharge planning.The patient is planning to be discharged  home after overnight observation ° ° ° °

## 2021-04-09 NOTE — Progress Notes (Signed)
°   04/09/21 1330  OBSTRUCTIVE SLEEP APNEA  Have you ever been diagnosed with sleep apnea through a sleep study? No  Do you snore loudly (loud enough to be heard through closed doors)?  0  Do you often feel tired, fatigued, or sleepy during the daytime (such as falling asleep during driving or talking to someone)? 1  Has anyone observed you stop breathing during your sleep? 1  Do you have, or are you being treated for high blood pressure? 1  BMI more than 35 kg/m2? 1  Age > 50 (1-yes) 1  Neck circumference greater than:Male 16 inches or larger, Male 17inches or larger? 0  Male Gender (Yes=1) 1  Obstructive Sleep Apnea Score 6

## 2021-04-10 NOTE — Progress Notes (Signed)
Anesthesia Chart Review   Case: [id number] Date/Time: 04/18/21 1115   Procedure: TOTAL HIP ARTHROPLASTY (Left: Hip)   Anesthesia type: Spinal   Pre-op diagnosis: Left hip osteoarthritis   Location: [facility]OM 07 / [facility]   Surgeons: Rod Can, MD       DISCUSSION:61 y.o. every day smoker with h/o HTN, CAD (stet to mid RCA 2019), DM II (A1C 6.8), left hip OA scheduled for above procedure 04/18/2021 with Dr. Rod Can.   Pt last seen by cardiology 02/20/21. Per OV note, "Patient is acceptable risk for left hip arthroplasty from cardiac point of view."  Anticipate pt can proceed with planned procedure barring acute status change.   VS: BP 132/90    Pulse 100    Temp 37.1 C (Oral)    Resp 16    Ht 5' 3.5" (1.613 m)    Wt 91.1 kg    SpO2 93%    BMI 35.02 kg/m   PROVIDERS: Jeanie Sewer, NP is PCP   Charolette Forward, MD is Cardiologist  LABS: Labs reviewed: Acceptable for surgery. (all labs ordered are listed, but only abnormal results are displayed)  Labs Reviewed  HEMOGLOBIN A1C - Abnormal; Notable for the following components:      Result Value   Hgb A1c MFr Bld 6.8 (*)    All other components within normal limits  BASIC METABOLIC PANEL - Abnormal; Notable for the following components:   Glucose, Bld 214 (*)    All other components within normal limits  GLUCOSE, CAPILLARY - Abnormal; Notable for the following components:   Glucose-Capillary 261 (*)    All other components within normal limits  SURGICAL PCR SCREEN  CBC     IMAGES:   EKG: 01/12/2021 Rate 81 bpm  NSR LAD  CV: Echo 03/18/2018 - Left ventricle: Mid and basal inferior/posterior basal    hypokinesisi. The cavity size was mildly dilated. Wall thickness    was normal. Systolic function was moderately reduced. The    estimated ejection fraction was in the range of 35% to 40%. Left    ventricular diastolic function parameters were normal.  - Atrial septum: No defect or patent foramen ovale was  identified. Past Medical History:  Diagnosis Date   Arthritis    BMI 32.0-32.9,adult    CAD (coronary artery disease)    Diabetes mellitus without complication ([place])    Hypertension    Kidney stones 2006   Myocardial infarction [facility])    Pneumonia     Past Surgical History:  Procedure Laterality Date   CARDIAC CATHETERIZATION     CORONARY STENT PLACEMENT  11/28/2011   2nd stent on 12/03/11   CORONARY/GRAFT ACUTE MI REVASCULARIZATION N/A 03/17/2018   Procedure: Coronary/Graft Acute MI Revascularization;  Surgeon: Leonie Man, MD;  Location: [place] [facility];  Service: Cardiovascular;  Laterality: N/A;   LEFT AND RIGHT HEART CATHETERIZATION WITH CORONARY ANGIOGRAM  11/29/2011   Procedure: LEFT AND RIGHT HEART CATHETERIZATION WITH CORONARY ANGIOGRAM;  Surgeon: Clent Demark, MD;  Location: [place] CATH [facility];  Service: Cardiovascular;;   LEFT HEART CATH AND CORONARY ANGIOGRAPHY N/A 03/17/2018   Procedure: LEFT HEART CATH AND CORONARY ANGIOGRAPHY;  Surgeon: Leonie Man, MD;  Location: [place] [facility];  Service: Cardiovascular;  Laterality: N/A;   LEFT HEART CATHETERIZATION WITH CORONARY ANGIOGRAM N/A 12/03/2011   Procedure: LEFT HEART CATHETERIZATION WITH CORONARY ANGIOGRAM;  Surgeon: Clent Demark, MD;  Location: [facility] CATH LAB;  Service: Cardiovascular;  Laterality: N/A;  NO PAST SURGERIES     PERCUTANEOUS CORONARY STENT INTERVENTION (PCI-S)  11/29/2011   Procedure: PERCUTANEOUS CORONARY STENT INTERVENTION (PCI-S);  Surgeon: Clent Demark, MD;  Location: [facility] CATH LAB;  Service: Cardiovascular;;   TRACHEOSTOMY      MEDICATIONS:  acetaminophen (TYLENOL) 650 MG CR tablet   aspirin EC 81 MG tablet   atorvastatin (LIPITOR) 80 MG tablet   FARXIGA 10 MG TABS tablet   losartan (COZAAR) 100 MG tablet   meloxicam (MOBIC) 7.5 MG tablet   metFORMIN (GLUCOPHAGE-XR) 500 MG 24 hr tablet   metoprolol succinate (TOPROL-XL) 25 MG 24 hr tablet   nitroGLYCERIN (NITROSTAT) 0.4 MG SL tablet    No current facility-administered medications for this encounter.    Konrad Felix Ward, PA-C [facility] [phone]

## 2021-04-10 NOTE — Anesthesia Preprocedure Evaluation (Addendum)
Anesthesia Evaluation  Patient identified by MRN, date of birth, ID band Patient awake    Reviewed: Allergy & Precautions, NPO status , Patient's Chart, lab work & pertinent test results  Airway Mallampati: III  TM Distance: >3 FB Neck ROM: Full    Dental no notable dental hx.    Pulmonary  Quit smoking 3 years ago   Pulmonary exam normal breath sounds clear to auscultation       Cardiovascular hypertension, Pt. on medications and Pt. on home beta blockers + CAD, + Past MI, + Cardiac Stents and +CHF  Normal cardiovascular exam Rhythm:Regular Rate:Normal  Echo 03/18/2018 - Left ventricle: Mid and basal inferior/posterior basal  hypokinesisi. The cavity size was mildly dilated. Wall thickness  was normal. Systolic function was moderately reduced. The  estimated ejection fraction was in the range of 35% to 40%. Left  ventricular diastolic function parameters were normal   Neuro/Psych negative neurological ROS  negative psych ROS   GI/Hepatic negative GI ROS, Neg liver ROS,   Endo/Other  diabetes, Type 2  Renal/GU negative Renal ROS  negative genitourinary   Musculoskeletal negative musculoskeletal ROS (+)   Abdominal   Peds negative pediatric ROS (+)  Hematology negative hematology ROS (+)   Anesthesia Other Findings   Reproductive/Obstetrics negative OB ROS                          Anesthesia Physical Anesthesia Plan  ASA: 3  Anesthesia Plan: Spinal   Post-op Pain Management: Dilaudid IV   Induction: Intravenous  PONV Risk Score and Plan: 1 and Propofol infusion and Treatment may vary due to age or medical condition  Airway Management Planned: Simple Face Mask  Additional Equipment:   Intra-op Plan:   Post-operative Plan:   Informed Consent: I have reviewed the patients History and Physical, chart, labs and discussed the procedure including the risks, benefits and  alternatives for the proposed anesthesia with the patient or authorized representative who has indicated his/her understanding and acceptance.     Dental advisory given  Plan Discussed with: CRNA and Surgeon  Anesthesia Plan Comments: (See PAT note 04/09/2021, Jodell Cipro Ward, PA-C)       Anesthesia Quick Evaluation

## 2021-04-16 ENCOUNTER — Other Ambulatory Visit: Payer: Self-pay

## 2021-04-16 ENCOUNTER — Encounter (HOSPITAL_COMMUNITY)
Admission: RE | Admit: 2021-04-16 | Discharge: 2021-04-16 | Disposition: A | Discharge status: Home / Self Care | Payer: No Typology Code available for payment source | Source: Ambulatory Visit | Attending: Orthopedic Surgery | Admitting: Orthopedic Surgery

## 2021-04-16 DIAGNOSIS — Z01812 Encounter for preprocedural laboratory examination: Secondary | ICD-10-CM | POA: Insufficient documentation

## 2021-04-16 DIAGNOSIS — Z20822 Contact with and (suspected) exposure to covid-19: Secondary | ICD-10-CM | POA: Insufficient documentation

## 2021-04-16 DIAGNOSIS — Z01818 Encounter for other preprocedural examination: Secondary | ICD-10-CM

## 2021-04-17 LAB — SARS CORONAVIRUS 2 (TAT 6-24 HRS): SARS Coronavirus 2: NEGATIVE

## 2021-04-18 ENCOUNTER — Ambulatory Visit (HOSPITAL_COMMUNITY): Payer: No Typology Code available for payment source

## 2021-04-18 ENCOUNTER — Ambulatory Visit (HOSPITAL_COMMUNITY)
Admission: RE | Admit: 2021-04-18 | Discharge: 2021-04-19 | Disposition: A | Discharge status: Home / Self Care | Payer: No Typology Code available for payment source | Source: Ambulatory Visit | Attending: Orthopedic Surgery | Admitting: Orthopedic Surgery

## 2021-04-18 ENCOUNTER — Encounter (HOSPITAL_COMMUNITY): Payer: Self-pay | Admitting: Orthopedic Surgery

## 2021-04-18 ENCOUNTER — Ambulatory Visit (HOSPITAL_COMMUNITY): Payer: No Typology Code available for payment source | Admitting: Physician Assistant

## 2021-04-18 ENCOUNTER — Other Ambulatory Visit: Payer: Self-pay

## 2021-04-18 ENCOUNTER — Ambulatory Visit (HOSPITAL_COMMUNITY): Payer: No Typology Code available for payment source | Admitting: Anesthesiology

## 2021-04-18 ENCOUNTER — Encounter (HOSPITAL_COMMUNITY)
Admission: RE | Disposition: A | Discharge status: Home / Self Care | Payer: Self-pay | Source: Ambulatory Visit | Attending: Orthopedic Surgery

## 2021-04-18 DIAGNOSIS — Z419 Encounter for procedure for purposes other than remedying health state, unspecified: Secondary | ICD-10-CM

## 2021-04-18 DIAGNOSIS — M1612 Unilateral primary osteoarthritis, left hip: Secondary | ICD-10-CM

## 2021-04-18 DIAGNOSIS — I5040 Unspecified combined systolic (congestive) and diastolic (congestive) heart failure: Secondary | ICD-10-CM | POA: Insufficient documentation

## 2021-04-18 DIAGNOSIS — I110 Hypertensive heart disease with heart failure: Secondary | ICD-10-CM | POA: Diagnosis not present

## 2021-04-18 DIAGNOSIS — I252 Old myocardial infarction: Secondary | ICD-10-CM | POA: Diagnosis not present

## 2021-04-18 DIAGNOSIS — Z7984 Long term (current) use of oral hypoglycemic drugs: Secondary | ICD-10-CM | POA: Insufficient documentation

## 2021-04-18 DIAGNOSIS — F17210 Nicotine dependence, cigarettes, uncomplicated: Secondary | ICD-10-CM | POA: Insufficient documentation

## 2021-04-18 DIAGNOSIS — Z09 Encounter for follow-up examination after completed treatment for conditions other than malignant neoplasm: Secondary | ICD-10-CM

## 2021-04-18 DIAGNOSIS — Z951 Presence of aortocoronary bypass graft: Secondary | ICD-10-CM | POA: Insufficient documentation

## 2021-04-18 DIAGNOSIS — I2510 Atherosclerotic heart disease of native coronary artery without angina pectoris: Secondary | ICD-10-CM | POA: Diagnosis not present

## 2021-04-18 DIAGNOSIS — Z79899 Other long term (current) drug therapy: Secondary | ICD-10-CM | POA: Insufficient documentation

## 2021-04-18 DIAGNOSIS — E119 Type 2 diabetes mellitus without complications: Secondary | ICD-10-CM | POA: Insufficient documentation

## 2021-04-18 HISTORY — PX: TOTAL HIP ARTHROPLASTY: SHX124

## 2021-04-18 LAB — GLUCOSE, CAPILLARY
Glucose-Capillary: 122 mg/dL — ABNORMAL HIGH (ref 70–99)
Glucose-Capillary: 204 mg/dL — ABNORMAL HIGH (ref 70–99)
Glucose-Capillary: 255 mg/dL — ABNORMAL HIGH (ref 70–99)
Glucose-Capillary: 164 mg/dL — ABNORMAL HIGH (ref 70–99)

## 2021-04-18 SURGERY — ARTHROPLASTY, HIP, TOTAL, ANTERIOR APPROACH
Anesthesia: Spinal | Site: Hip | Laterality: Left

## 2021-04-18 MED ORDER — METOPROLOL SUCCINATE ER 25 MG PO TB24
25.0000 mg | ORAL_TABLET | Freq: Every day | ORAL | Status: DC
  Administered 2021-04-18 – 2021-04-19 (×2): 25 mg via ORAL
  Filled 2021-04-18 (×2): qty 1

## 2021-04-18 MED ORDER — DIPHENHYDRAMINE HCL 12.5 MG/5ML PO ELIX: 12.5000 mg | ORAL_SOLUTION | ORAL | Status: DC | PRN

## 2021-04-18 MED ORDER — ALUM & MAG HYDROXIDE-SIMETH 200-200-20 MG/5ML PO SUSP: 30.0000 mL | ORAL | Status: DC | PRN

## 2021-04-18 MED ORDER — ACETAMINOPHEN 325 MG PO TABS
325.0000 mg | ORAL_TABLET | Freq: Four times a day (QID) | ORAL | Status: DC | PRN
  Administered 2021-04-19: 650 mg via ORAL
  Filled 2021-04-18: qty 2

## 2021-04-18 MED ORDER — IRRISEPT - 450ML BOTTLE WITH 0.05% CHG IN STERILE WATER, USP 99.95% OPTIME
TOPICAL | Status: DC | PRN
  Administered 2021-04-18: 450 mL

## 2021-04-18 MED ORDER — POVIDONE-IODINE 10 % EX SWAB
2.0000 "application " | Freq: Once | CUTANEOUS | Status: AC
  Administered 2021-04-18: 2 via TOPICAL

## 2021-04-18 MED ORDER — HYDROMORPHONE HCL 1 MG/ML IJ SOLN: 0.2500 mg | INTRAMUSCULAR | Status: DC | PRN

## 2021-04-18 MED ORDER — POVIDONE-IODINE 10 % EX SWAB: 2.0000 "application " | Freq: Once | CUTANEOUS | Status: DC

## 2021-04-18 MED ORDER — FENTANYL CITRATE (PF) 100 MCG/2ML IJ SOLN
INTRAMUSCULAR | Status: AC
  Filled 2021-04-18: qty 2

## 2021-04-18 MED ORDER — ISOPROPYL ALCOHOL 70 % SOLN
Status: DC | PRN
  Administered 2021-04-18: 1 via TOPICAL

## 2021-04-18 MED ORDER — ORAL CARE MOUTH RINSE: 15.0000 mL | Freq: Once | OROMUCOSAL | Status: AC

## 2021-04-18 MED ORDER — CHLORHEXIDINE GLUCONATE 0.12 % MT SOLN
15.0000 mL | Freq: Once | OROMUCOSAL | Status: AC
  Administered 2021-04-18: 15 mL via OROMUCOSAL

## 2021-04-18 MED ORDER — SODIUM CHLORIDE 0.9 % IV SOLN: INTRAVENOUS | Status: DC

## 2021-04-18 MED ORDER — METHOCARBAMOL 500 MG IVPB - SIMPLE MED
500.0000 mg | Freq: Four times a day (QID) | INTRAVENOUS | Status: DC | PRN
Start: 2021-04-18 — End: 2021-04-19
  Filled 2021-04-18: qty 50

## 2021-04-18 MED ORDER — METOCLOPRAMIDE HCL 5 MG PO TABS: 5.0000 mg | ORAL_TABLET | Freq: Three times a day (TID) | ORAL | Status: DC | PRN

## 2021-04-18 MED ORDER — MORPHINE SULFATE (PF) 2 MG/ML IV SOLN: 0.5000 mg | INTRAVENOUS | Status: DC | PRN

## 2021-04-18 MED ORDER — CEFAZOLIN SODIUM-DEXTROSE 2-4 GM/100ML-% IV SOLN
2.0000 g | Freq: Four times a day (QID) | INTRAVENOUS | Status: AC
  Administered 2021-04-18 – 2021-04-19 (×2): 2 g via INTRAVENOUS
  Filled 2021-04-18 (×2): qty 100

## 2021-04-18 MED ORDER — KETOROLAC TROMETHAMINE 15 MG/ML IJ SOLN
15.0000 mg | Freq: Four times a day (QID) | INTRAMUSCULAR | Status: AC
  Administered 2021-04-18 – 2021-04-19 (×4): 15 mg via INTRAVENOUS
  Filled 2021-04-18 (×4): qty 1

## 2021-04-18 MED ORDER — FENTANYL CITRATE (PF) 100 MCG/2ML IJ SOLN
INTRAMUSCULAR | Status: DC | PRN
  Administered 2021-04-18: 50 ug via INTRAVENOUS
  Administered 2021-04-18 (×2): 25 ug via INTRAVENOUS

## 2021-04-18 MED ORDER — CEFAZOLIN SODIUM-DEXTROSE 2-4 GM/100ML-% IV SOLN
2.0000 g | INTRAVENOUS | Status: AC
  Administered 2021-04-18: 2 g via INTRAVENOUS
  Filled 2021-04-18: qty 100

## 2021-04-18 MED ORDER — ONDANSETRON HCL 4 MG/2ML IJ SOLN: 4.0000 mg | Freq: Four times a day (QID) | INTRAMUSCULAR | Status: DC | PRN

## 2021-04-18 MED ORDER — 0.9 % SODIUM CHLORIDE (POUR BTL) OPTIME
TOPICAL | Status: DC | PRN
  Administered 2021-04-18: 1000 mL

## 2021-04-18 MED ORDER — INSULIN ASPART 100 UNIT/ML IJ SOLN: 0.0000 [IU] | Freq: Every day | INTRAMUSCULAR | Status: DC

## 2021-04-18 MED ORDER — ACETAMINOPHEN 10 MG/ML IV SOLN: 1000.0000 mg | Freq: Once | INTRAVENOUS | Status: DC | PRN

## 2021-04-18 MED ORDER — METHOCARBAMOL 500 MG PO TABS
500.0000 mg | ORAL_TABLET | Freq: Four times a day (QID) | ORAL | Status: DC | PRN
  Administered 2021-04-18 – 2021-04-19 (×2): 500 mg via ORAL
  Filled 2021-04-18 (×2): qty 1

## 2021-04-18 MED ORDER — PHENOL 1.4 % MT LIQD: 1.0000 | OROMUCOSAL | Status: DC | PRN

## 2021-04-18 MED ORDER — LOSARTAN POTASSIUM 50 MG PO TABS
100.0000 mg | ORAL_TABLET | Freq: Every day | ORAL | Status: DC
  Administered 2021-04-19: 100 mg via ORAL
  Filled 2021-04-18: qty 2

## 2021-04-18 MED ORDER — METOCLOPRAMIDE HCL 5 MG/ML IJ SOLN: 5.0000 mg | Freq: Three times a day (TID) | INTRAMUSCULAR | Status: DC | PRN

## 2021-04-18 MED ORDER — POLYETHYLENE GLYCOL 3350 17 G PO PACK: 17.0000 g | PACK | Freq: Every day | ORAL | Status: DC | PRN

## 2021-04-18 MED ORDER — KETOROLAC TROMETHAMINE 30 MG/ML IJ SOLN
INTRAMUSCULAR | Status: DC | PRN
  Administered 2021-04-18: 30 mg via INTRA_ARTICULAR

## 2021-04-18 MED ORDER — PROPOFOL 500 MG/50ML IV EMUL
INTRAVENOUS | Status: DC | PRN
  Administered 2021-04-18: 40 ug/kg/min via INTRAVENOUS

## 2021-04-18 MED ORDER — LACTATED RINGERS IV SOLN: INTRAVENOUS | Status: DC

## 2021-04-18 MED ORDER — HYDROCODONE-ACETAMINOPHEN 7.5-325 MG PO TABS: 1.0000 | ORAL_TABLET | ORAL | Status: DC | PRN

## 2021-04-18 MED ORDER — BUPIVACAINE-EPINEPHRINE (PF) 0.25% -1:200000 IJ SOLN
INTRAMUSCULAR | Status: AC
  Filled 2021-04-18: qty 30

## 2021-04-18 MED ORDER — PROMETHAZINE HCL 25 MG/ML IJ SOLN: 6.2500 mg | INTRAMUSCULAR | Status: DC | PRN

## 2021-04-18 MED ORDER — DOCUSATE SODIUM 100 MG PO CAPS
100.0000 mg | ORAL_CAPSULE | Freq: Two times a day (BID) | ORAL | Status: DC
  Administered 2021-04-18 – 2021-04-19 (×2): 100 mg via ORAL
  Filled 2021-04-18 (×2): qty 1

## 2021-04-18 MED ORDER — ONDANSETRON HCL 4 MG PO TABS: 4.0000 mg | ORAL_TABLET | Freq: Four times a day (QID) | ORAL | Status: DC | PRN

## 2021-04-18 MED ORDER — DAPAGLIFLOZIN PROPANEDIOL 10 MG PO TABS
10.0000 mg | ORAL_TABLET | Freq: Every day | ORAL | Status: DC
  Administered 2021-04-19: 10 mg via ORAL
  Filled 2021-04-18: qty 1

## 2021-04-18 MED ORDER — DEXAMETHASONE SODIUM PHOSPHATE 10 MG/ML IJ SOLN
10.0000 mg | Freq: Once | INTRAMUSCULAR | Status: AC
  Administered 2021-04-19: 10 mg via INTRAVENOUS
  Filled 2021-04-18: qty 1

## 2021-04-18 MED ORDER — ASPIRIN 81 MG PO CHEW
81.0000 mg | CHEWABLE_TABLET | Freq: Two times a day (BID) | ORAL | Status: DC
  Administered 2021-04-18 – 2021-04-19 (×2): 81 mg via ORAL
  Filled 2021-04-18 (×2): qty 1

## 2021-04-18 MED ORDER — TRANEXAMIC ACID-NACL 1000-0.7 MG/100ML-% IV SOLN
1000.0000 mg | INTRAVENOUS | Status: AC
  Administered 2021-04-18: 1000 mg via INTRAVENOUS
  Filled 2021-04-18: qty 100

## 2021-04-18 MED ORDER — ONDANSETRON HCL 4 MG/2ML IJ SOLN
INTRAMUSCULAR | Status: DC | PRN
Start: 2021-04-18 — End: 2021-04-18
  Administered 2021-04-18: 4 mg via INTRAVENOUS

## 2021-04-18 MED ORDER — INSULIN ASPART 100 UNIT/ML IJ SOLN
0.0000 [IU] | Freq: Three times a day (TID) | INTRAMUSCULAR | Status: DC
  Administered 2021-04-18: 5 [IU] via SUBCUTANEOUS
  Administered 2021-04-19: 2 [IU] via SUBCUTANEOUS

## 2021-04-18 MED ORDER — SENNA 8.6 MG PO TABS
1.0000 | ORAL_TABLET | Freq: Two times a day (BID) | ORAL | Status: DC
  Administered 2021-04-18 – 2021-04-19 (×2): 8.6 mg via ORAL
  Filled 2021-04-18 (×2): qty 1

## 2021-04-18 MED ORDER — STERILE WATER FOR IRRIGATION IR SOLN
Status: DC | PRN
  Administered 2021-04-18: 1000 mL

## 2021-04-18 MED ORDER — MENTHOL 3 MG MT LOZG: 1.0000 | LOZENGE | OROMUCOSAL | Status: DC | PRN

## 2021-04-18 MED ORDER — BUPIVACAINE IN DEXTROSE 0.75-8.25 % IT SOLN
INTRATHECAL | Status: DC | PRN
  Administered 2021-04-18: 1.8 mL via INTRATHECAL

## 2021-04-18 MED ORDER — MIDAZOLAM HCL 2 MG/2ML IJ SOLN
INTRAMUSCULAR | Status: AC
  Filled 2021-04-18: qty 2

## 2021-04-18 MED ORDER — MIDAZOLAM HCL 5 MG/5ML IJ SOLN
INTRAMUSCULAR | Status: DC | PRN
  Administered 2021-04-18: 2 mg via INTRAVENOUS

## 2021-04-18 MED ORDER — KETOROLAC TROMETHAMINE 30 MG/ML IJ SOLN
INTRAMUSCULAR | Status: AC
  Filled 2021-04-18: qty 1

## 2021-04-18 MED ORDER — HYDROCODONE-ACETAMINOPHEN 5-325 MG PO TABS
1.0000 | ORAL_TABLET | ORAL | Status: DC | PRN
  Administered 2021-04-18: 2 via ORAL
  Filled 2021-04-18: qty 2

## 2021-04-18 MED ORDER — ACETAMINOPHEN 10 MG/ML IV SOLN
1000.0000 mg | Freq: Once | INTRAVENOUS | Status: AC
  Administered 2021-04-18: 1000 mg via INTRAVENOUS
  Filled 2021-04-18: qty 100

## 2021-04-18 MED ORDER — BUPIVACAINE-EPINEPHRINE 0.25% -1:200000 IJ SOLN
INTRAMUSCULAR | Status: DC | PRN
  Administered 2021-04-18: 30 mL

## 2021-04-18 MED ORDER — SODIUM CHLORIDE 0.9 % IR SOLN
Status: DC | PRN
  Administered 2021-04-18: 1000 mL

## 2021-04-18 SURGICAL SUPPLY — 81 items
ADAPTER DRAIN UROSTOMY TUBE NS (OSTOMY) IMPLANT
BAG COUNTER SPONGE SURGICOUNT (BAG) IMPLANT
BAG DECANTER FOR FLEXI CONT (MISCELLANEOUS) ×3 IMPLANT
BAG SURGICOUNT SPONGE COUNTING (BAG)
BAG ZIPLOCK 12X15 (MISCELLANEOUS) ×3 IMPLANT
BLADE SURG SZ10 CARB STEEL (BLADE) ×6 IMPLANT
CHLORAPREP W/TINT 26 (MISCELLANEOUS) ×6 IMPLANT
CNTNR URN SCR LID CUP LEK RST (MISCELLANEOUS) IMPLANT
CONT SPEC 4OZ STRL OR WHT (MISCELLANEOUS)
COVER PERINEAL POST (MISCELLANEOUS) ×3 IMPLANT
COVER SURGICAL LIGHT HANDLE (MISCELLANEOUS) ×6 IMPLANT
DECANTER SPIKE VIAL GLASS SM (MISCELLANEOUS) ×3 IMPLANT
DERMABOND ADVANCED (GAUZE/BANDAGES/DRESSINGS) ×2
DERMABOND ADVANCED .7 DNX12 (GAUZE/BANDAGES/DRESSINGS) ×4 IMPLANT
DRAPE IMP U-DRAPE 54X76 (DRAPES) ×3 IMPLANT
DRAPE ORTHO SPLIT 77X108 STRL (DRAPES) ×6
DRAPE POUCH INSTRU U-SHP 10X18 (DRAPES) ×3 IMPLANT
DRAPE SHEET LG 3/4 BI-LAMINATE (DRAPES) ×9 IMPLANT
DRAPE STERI IOBAN 125X83 (DRAPES) ×3 IMPLANT
DRAPE SURG 17X11 SM STRL (DRAPES) ×3 IMPLANT
DRAPE SURG ORHT 6 SPLT 77X108 (DRAPES) ×2 IMPLANT
DRAPE U-SHAPE 47X51 STRL (DRAPES) ×9 IMPLANT
DRSG AQUACEL AG ADV 3.5X10 (GAUZE/BANDAGES/DRESSINGS) ×4 IMPLANT
ELECT BLADE TIP CTD 4 INCH (ELECTRODE) ×3 IMPLANT
ELECT REM PT RETURN 15FT ADLT (MISCELLANEOUS) ×6 IMPLANT
FACESHIELD WRAPAROUND (MASK) ×6 IMPLANT
FACESHIELD WRAPAROUND OR TEAM (MASK) ×2 IMPLANT
GAUZE SPONGE 4X4 12PLY STRL (GAUZE/BANDAGES/DRESSINGS) ×3 IMPLANT
GLOVE SRG 8 PF TXTR STRL LF DI (GLOVE) ×2 IMPLANT
GLOVE SURG ENC MOIS LTX SZ8.5 (GLOVE) ×12 IMPLANT
GLOVE SURG ENC TEXT LTX SZ7.5 (GLOVE) ×12 IMPLANT
GLOVE SURG UNDER POLY LF SZ8 (GLOVE) ×6
GLOVE SURG UNDER POLY LF SZ8.5 (GLOVE) ×6 IMPLANT
GOWN SPEC L3 XXLG W/TWL (GOWN DISPOSABLE) ×6 IMPLANT
GOWN STRL REUS W/TWL XL LVL3 (GOWN DISPOSABLE) ×6 IMPLANT
HANDPIECE INTERPULSE COAX TIP (DISPOSABLE) ×6
HEAD CERAMIC BIOLOX 32 TP1 -3 (Head) ×2 IMPLANT
HOLDER FOLEY CATH W/STRAP (MISCELLANEOUS) ×3 IMPLANT
HOOD PEEL AWAY FLYTE STAYCOOL (MISCELLANEOUS) ×12 IMPLANT
JET LAVAGE IRRISEPT WOUND (IRRIGATION / IRRIGATOR) ×3
KIT TURNOVER KIT A (KITS) IMPLANT
LAVAGE JET IRRISEPT WOUND (IRRIGATION / IRRIGATOR) ×1 IMPLANT
LINER G7 VIT E NTRL 32 SZD HIP (Liner) ×2 IMPLANT
MANIFOLD NEPTUNE II (INSTRUMENTS) ×6 IMPLANT
MARKER SKIN DUAL TIP RULER LAB (MISCELLANEOUS) ×3 IMPLANT
NDL SAFETY ECLIPSE 18X1.5 (NEEDLE) ×1 IMPLANT
NDL SPNL 18GX3.5 QUINCKE PK (NEEDLE) ×2 IMPLANT
NEEDLE HYPO 18GX1.5 SHARP (NEEDLE) ×3
NEEDLE SPNL 18GX3.5 QUINCKE PK (NEEDLE) ×6 IMPLANT
NS IRRIG 1000ML POUR BTL (IV SOLUTION) ×3 IMPLANT
PACK ANTERIOR HIP CUSTOM (KITS) ×3 IMPLANT
PENCIL SMOKE EVACUATOR (MISCELLANEOUS) IMPLANT
PROTECTOR NERVE ULNAR (MISCELLANEOUS) ×3 IMPLANT
SAW OSC TIP CART 19.5X105X1.3 (SAW) ×6 IMPLANT
SEALER BIPOLAR AQUA 6.0 (INSTRUMENTS) ×6 IMPLANT
SET HNDPC FAN SPRY TIP SCT (DISPOSABLE) ×2 IMPLANT
SHELL ACET G7 3H 50 SZD (Shell) ×2 IMPLANT
SPONGE T-LAP 18X18 ~~LOC~~+RFID (SPONGE) ×18 IMPLANT
STAPLER INSORB 30 2030 C-SECTI (MISCELLANEOUS) IMPLANT
STEM FEM CMTLS 9X102.5 133D (Stem) ×2 IMPLANT
SUCTION FRAZIER HANDLE 10FR (MISCELLANEOUS) ×3
SUCTION TUBE FRAZIER 10FR DISP (MISCELLANEOUS) ×1 IMPLANT
SUT ETHIBOND NAB CT1 #1 30IN (SUTURE) ×6 IMPLANT
SUT MNCRL AB 3-0 PS2 18 (SUTURE) ×6 IMPLANT
SUT MON AB 2-0 CT1 36 (SUTURE) ×9 IMPLANT
SUT STRATAFIX PDO 1 14 VIOLET (SUTURE) ×3
SUT STRATFX PDO 1 14 VIOLET (SUTURE) ×1
SUT VIC AB 1 CT1 36 (SUTURE) ×6 IMPLANT
SUT VIC AB 2-0 CT1 27 (SUTURE) ×3
SUT VIC AB 2-0 CT1 TAPERPNT 27 (SUTURE) ×1 IMPLANT
SUT VLOC 180 0 24IN GS25 (SUTURE) ×6 IMPLANT
SUTURE STRATFX PDO 1 14 VIOLET (SUTURE) ×1 IMPLANT
SWAB COLLECTION DEVICE MRSA (MISCELLANEOUS) IMPLANT
SWAB CULTURE ESWAB REG 1ML (MISCELLANEOUS) IMPLANT
SYR 3ML LL SCALE MARK (SYRINGE) ×3 IMPLANT
SYR 50ML LL SCALE MARK (SYRINGE) ×3 IMPLANT
TOWEL OR 17X26 10 PK STRL BLUE (TOWEL DISPOSABLE) ×6 IMPLANT
TOWER CARTRIDGE SMART MIX (DISPOSABLE) IMPLANT
TRAY FOLEY MTR SLVR 16FR STAT (SET/KITS/TRAYS/PACK) ×3 IMPLANT
TUBE SUCTION HIGH CAP CLEAR NV (SUCTIONS) ×3 IMPLANT
WATER STERILE IRR 1000ML POUR (IV SOLUTION) ×6 IMPLANT

## 2021-04-18 NOTE — Discharge Instructions (Signed)
? ?Dr. Chaim Gatley ?Joint Replacement Specialist ?[facility] ?[address] ?[place], [place] [address] ?[phone] ? ? ?TOTAL HIP REPLACEMENT POSTOPERATIVE DIRECTIONS ? ? ? ?Hip Rehabilitation, Guidelines Following Surgery  ? ?WEIGHT BEARING ?Weight bearing as tolerated with assist device (walker, cane, etc) as directed, use it as long as suggested by your surgeon or therapist, typically at least 4-6 weeks. ? ?The results of a hip operation are greatly improved after range of motion and muscle strengthening exercises. Follow all safety measures which are given to protect your hip. If any of these exercises cause increased pain or swelling in your joint, decrease the amount until you are comfortable again. Then slowly increase the exercises. Call your caregiver if you have problems or questions.  ? ?HOME CARE INSTRUCTIONS  ?Most of the following instructions are designed to prevent the dislocation of your new hip.  ?Remove items at home which could result in a fall. This includes throw rugs or furniture in walking pathways.  ?Continue medications as instructed at time of discharge. ?You may have some home medications which will be placed on hold until you complete the course of blood thinner medication. ?You may start showering once you are discharged home. Do not remove your dressing. ?Do not put on socks or shoes without following the instructions of your caregivers.   ?Sit on chairs with arms. Use the chair arms to help push yourself up when arising.  ?Arrange for the use of a toilet seat elevator so you are not sitting low.  ?Walk with walker as instructed.  ?You may resume a sexual relationship in one month or when given the OK by your caregiver.  ?Use walker as long as suggested by your caregivers.  ?You may put full weight on your legs and walk as much as is comfortable. ?Avoid periods of inactivity such as sitting longer than an hour when not asleep. This helps prevent blood  clots.  ?You may return to work once you are cleared by your surgeon.  ?Do not drive a car for 6 weeks or until released by your surgeon.  ?Do not drive while taking narcotics.  ?Wear elastic stockings for two weeks following surgery during the day but you may remove then at night.  ?Make sure you keep all of your appointments after your operation with all of your doctors and caregivers. You should call the office at the above phone number and make an appointment for approximately two weeks after the date of your surgery. ?Please pick up a stool softener and laxative for home use as long as you are requiring pain medications. ?ICE to the affected hip every three hours for 30 minutes at a time and then as needed for pain and swelling. Continue to use ice on the hip for pain and swelling from surgery. You may notice swelling that will progress down to the foot and ankle.  This is normal after surgery.  Elevate the leg when you are not up walking on it.   ?It is important for you to complete the blood thinner medication as prescribed by your doctor. ?Continue to use the breathing machine which will help keep your temperature down.  It is common for your temperature to cycle up and down following surgery, especially at night when you are not up moving around and exerting yourself.  The breathing machine keeps your lungs expanded and your temperature down. ? ?RANGE OF MOTION AND STRENGTHENING EXERCISES  ?These exercises are designed to help you   keep full movement of your hip joint. Follow your caregiver's or physical therapist's instructions. Perform all exercises about fifteen times, three times per day or as directed. Exercise both hips, even if you have had only one joint replacement. These exercises can be done on a training (exercise) mat, on the floor, on a table or on a bed. Use whatever works the best and is most comfortable for you. Use music or television while you are exercising so that the exercises are a  pleasant break in your day. This will make your life better with the exercises acting as a break in routine you can look forward to.  ?Lying on your back, slowly slide your foot toward your buttocks, raising your knee up off the floor. Then slowly slide your foot back down until your leg is straight again.  ?Lying on your back spread your legs as far apart as you can without causing discomfort.  ?Lying on your side, raise your upper leg and foot straight up from the floor as far as is comfortable. Slowly lower the leg and repeat.  ?Lying on your back, tighten up the muscle in the front of your thigh (quadriceps muscles). You can do this by keeping your leg straight and trying to raise your heel off the floor. This helps strengthen the largest muscle supporting your knee.  ?Lying on your back, tighten up the muscles of your buttocks both with the legs straight and with the knee bent at a comfortable angle while keeping your heel on the floor.  ? ?SKILLED REHAB INSTRUCTIONS: ?If the patient is transferred to a skilled rehab facility following release from the hospital, a list of the current medications will be sent to the facility for the patient to continue.  When discharged from the skilled rehab facility, please have the facility set up the patient's Home Health Physical Therapy prior to being released. Also, the skilled facility will be responsible for providing the patient with their medications at time of release from the facility to include their pain medication and their blood thinner medication. If the patient is still at the rehab facility at time of the two week follow up appointment, the skilled rehab facility will also need to assist the patient in arranging follow up appointment in our office and any transportation needs. ? ?POST-OPERATIVE OPIOID TAPER INSTRUCTIONS: ?It is important to wean off of your opioid medication as soon as possible. If you do not need pain medication after your surgery it is ok  to stop day one. ?Opioids include: ?Codeine, Hydrocodone(Norco, Vicodin), Oxycodone(Percocet, oxycontin) and hydromorphone amongst others.  ?Long term and even short term use of opiods can cause: ?Increased pain response ?Dependence ?Constipation ?Depression ?Respiratory depression ?And more.  ?Withdrawal symptoms can include ?Flu like symptoms ?Nausea, vomiting ?And more ?Techniques to manage these symptoms ?Hydrate well ?Eat regular healthy meals ?Stay active ?Use relaxation techniques(deep breathing, meditating, yoga) ?Do Not substitute Alcohol to help with tapering ?If you have been on opioids for less than two weeks and do not have pain than it is ok to stop all together.  ?Plan to wean off of opioids ?This plan should start within one week post op of your joint replacement. ?Maintain the same interval or time between taking each dose and first decrease the dose.  ?Cut the total daily intake of opioids by one tablet each day ?Next start to increase the time between doses. ?The last dose that should be eliminated is the evening dose.  ? ? ?MAKE   SURE YOU:  ?Understand these instructions.  ?Will watch your condition.  ?Will get help right away if you are not doing well or get worse. ? ?Pick up stool softner and laxative for home use following surgery while on pain medications. ?Do not remove your dressing. ?The dressing is waterproof--it is OK to take showers. ?Continue to use ice for pain and swelling after surgery. ?Do not use any lotions or creams on the incision until instructed by your surgeon. ?Total Hip Protocol. ? ?

## 2021-04-18 NOTE — Plan of Care (Signed)
  Problem: Clinical Measurements: Goal: Ability to maintain clinical measurements within normal limits will improve Outcome: Progressing   Problem: Activity: Goal: Risk for activity intolerance will decrease Outcome: Progressing   Problem: Pain Managment: Goal: General experience of comfort will improve Outcome: Progressing   Problem: Safety: Goal: Ability to remain free from injury will improve Outcome: Progressing   

## 2021-04-18 NOTE — Op Note (Signed)
OPERATIVE REPORT  SURGEON: Rod Can, MD   ASSISTANT: Nehemiah Massed, PA-C.  PREOPERATIVE DIAGNOSIS: Left hip arthritis.   POSTOPERATIVE DIAGNOSIS: Left hip arthritis.   PROCEDURE: Left total hip arthroplasty, anterior approach.   IMPLANTS: Biomet Taperloc Complete Microplasty stem, size 9 x 102.5 mm, high offset. Biomet G7 Cup, size 50 mm. Biomet Vivacit-E liner, size 32 mm, D, neutral. Biomet Bioolox ceramic head ball, size 32 - 3 mm.  ANESTHESIA:  MAC and Spinal  ESTIMATED BLOOD LOSS:-200 mL    ANTIBIOTICS: 2 g Ancef.  DRAINS: None.  COMPLICATIONS: None.   CONDITION: PACU - hemodynamically stable.   BRIEF CLINICAL NOTE: Eddie Lewis is a 61 y.o. male with a long-standing history of Left hip arthritis. After failing conservative management, the patient was indicated for total hip arthroplasty. The risks, benefits, and alternatives to the procedure were explained, and the patient elected to proceed.  PROCEDURE IN DETAIL: Surgical site was marked by myself in the pre-op holding area. Once inside the operating room, spinal anesthesia was obtained, and a foley catheter was inserted. The patient was then positioned on the Hana table.  All bony prominences were well padded.  The hip was prepped and draped in the normal sterile surgical fashion.  A time-out was called verifying side and site of surgery. The patient received IV antibiotics within 60 minutes of beginning the procedure.   Bikini incision was made, and the direct anterior approach to the hip was performed through the Hueter interval.  Lateral femoral circumflex vessels were treated with the Auqumantys. The anterior capsule was exposed and an inverted T capsulotomy was made. The femoral neck cut was made to the level of the templated cut.  A corkscrew was placed into the head and the head was removed.  The femoral head was found to have eburnated bone. The head was passed to the back table and was measured.    Acetabular exposure was achieved, and the pulvinar and labrum were excised. Sequential reaming of the acetabulum was then performed up to a size 49 mm reamer. A 50 mm cup was then opened and impacted into place at approximately 40 degrees of abduction and 20 degrees of anteversion. The final polyethylene liner was impacted into place and acetabular osteophytes were removed.    I then gained femoral exposure taking care to protect the abductors and greater trochanter.  This was performed using standard external rotation, extension, and adduction.  The superior capsule was incised longitudinally, taking care to stay lateral to the posterior border of the femoral neck. A cookie cutter was used to enter the femoral canal, and then the femoral canal finder was placed.  Sequential broaching was performed up to a size 9.  Calcar planer was used on the femoral neck remnant.  I placed a high offset neck and a trial head ball.  The hip was reduced.  Leg lengths and offset were checked fluoroscopically.  The hip was dislocated and trial components were removed.  The final implants were placed, and the hip was reduced.  Fluoroscopy was used to confirm component position and leg lengths.  At 90 degrees of external rotation and full extension, the hip was stable to an anterior directed force.   The wound was copiously irrigated with Irrisept solution and normal saline using pule lavage.  Marcaine solution was injected into the periarticular soft tissue.  The wound was closed in layers using #1 Vicryl and V-Loc for the fascia, 2-0 Vicryl for the subcutaneous fat, 2-0 Monocryl  for the deep dermal layer, 3-0 running Monocryl subcuticular stitch, and Dermabond for the skin.  Once the glue was fully dried, an Aquacell Ag dressing was applied.  The patient was transported to the recovery room in stable condition.  Sponge, needle, and instrument counts were correct at the end of the case x2.  The patient tolerated the procedure  well and there were no known complications.  Please note that a surgical assistant was a medical necessity for this procedure to perform it in a safe and expeditious manner. Assistant was necessary to provide appropriate retraction of vital neurovascular structures, to prevent femoral fracture, and to allow for anatomic placement of the prosthesis.

## 2021-04-18 NOTE — Evaluation (Signed)
Physical Therapy Evaluation Patient Details Name: Eddie Lewis MRN: [id number] DOB: 10/10/1960 Today's Date: 04/18/2021  History of Present Illness  61 yo male s/p L DA THA. PMH: CABG, HTN, OA, DM, MI  Clinical Impression  Pt is s/p THA resulting in the deficits listed below (see PT Problem List).  Pt amb  ~ 32' with RW and min/guard assist. Anticipate steady progress in acute setting  Pt will benefit from skilled PT to increase their independence and safety with mobility to allow discharge to the venue listed below.         Recommendations for follow up therapy are one component of a multi-disciplinary discharge planning process, led by the attending physician.  Recommendations may be updated based on patient status, additional functional criteria and insurance authorization.  Follow Up Recommendations Follow physician's recommendations for discharge plan and follow up therapies    Assistance Recommended at Discharge Intermittent Supervision/Assistance  Patient can return home with the following  Help with stairs or ramp for entrance    Equipment Recommendations None recommended by PT  Recommendations for Other Services       Functional Status Assessment Patient has had a recent decline in their functional status and demonstrates the ability to make significant improvements in function in a reasonable and predictable amount of time.     Precautions / Restrictions Precautions Precautions: Fall Restrictions Weight Bearing Restrictions: No Other Position/Activity Restrictions: WBAT      Mobility  Bed Mobility Overal bed mobility: Needs Assistance Bed Mobility: Supine to Sit     Supine to sit: Min guard     General bed mobility comments: incr time, use of rail, cues for technique    Transfers Overall transfer level: Needs assistance Equipment used: Rolling walker (2 wheels) Transfers: Sit to/from Stand Sit to Stand: Min guard           General transfer  comment: cues for hand placement    Ambulation/Gait Ambulation/Gait assistance: Min assist, Min guard Gait Distance (Feet): 60 Feet Assistive device: Rolling walker (2 wheels) Gait Pattern/deviations: Step-to pattern, Decreased stance time - left       General Gait Details: cues for sequence and RW position  Stairs            Wheelchair Mobility    Modified Rankin (Stroke Patients Only)       Balance                                             Pertinent Vitals/Pain Pain Assessment Pain Assessment: Faces Faces Pain Scale: Hurts little more Pain Location: L hip Pain Descriptors / Indicators: Sore, Aching, Grimacing Pain Intervention(s): Limited activity within patient's tolerance, Monitored during session, Premedicated before session, Repositioned    Home Living Family/patient expects to be discharged to:: Private residence Living Arrangements: Spouse/significant other;Children Available Help at Discharge: Family Type of Home: Mobile home Home Access: Stairs to enter Entrance Stairs-Rails: Doctor, general practice of Steps: 3   Home Layout: One level Home Equipment: Cane - single Librarian, academic (2 wheels);Rollator (4 wheels)      Prior Function Prior Level of Function : Independent/Modified Independent             Mobility Comments: independent       Hand Dominance        Extremity/Trunk Assessment   Upper Extremity Assessment Upper Extremity Assessment: Overall [facility]  for tasks assessed    Lower Extremity Assessment Lower Extremity Assessment: LLE deficits/detail LLE Deficits / Details: ankle WFL, hip and knee grossly 2+/5, limited by post op pain       Communication   Communication: No difficulties  Cognition Arousal/Alertness: Awake/alert Behavior During Therapy: WFL for tasks assessed/performed Overall Cognitive Status: Within Functional Limits for tasks assessed                                           General Comments      Exercises Total Joint Exercises Ankle Circles/Pumps: AROM, Both, 10 reps   Assessment/Plan    PT Assessment Patient needs continued PT services  PT Problem List Decreased strength;Decreased mobility;Decreased activity tolerance;Decreased balance;Pain       PT Treatment Interventions DME instruction;Therapeutic activities;Gait training;Functional mobility training;Therapeutic exercise;Patient/family education;Stair training    PT Goals (Current goals can be found in the Care Plan section)  Acute Rehab PT Goals Patient Stated Goal: home PT Goal Formulation: With patient Time For Goal Achievement: 04/25/21 Potential to Achieve Goals: Good    Frequency 7X/week     Co-evaluation               AM-PAC PT "6 Clicks" Mobility  Outcome Measure Help needed turning from your back to your side while in a flat bed without using bedrails?: A Little Help needed moving from lying on your back to sitting on the side of a flat bed without using bedrails?: A Little Help needed moving to and from a bed to a chair (including a wheelchair)?: A Little Help needed standing up from a chair using your arms (e.g., wheelchair or bedside chair)?: A Little Help needed to walk in hospital room?: A Little Help needed climbing 3-5 steps with a railing? : A Lot 6 Click Score: 17    End of Session Equipment Utilized During Treatment: Gait belt Activity Tolerance: Patient tolerated treatment well Patient left: with call bell/phone within reach;in chair;with chair alarm set;with family/visitor present Nurse Communication: Mobility status PT Visit Diagnosis: Other abnormalities of gait and mobility (R26.89);Difficulty in walking, not elsewhere classified (R26.2)    Time: 9450-3888 PT Time Calculation (min) (ACUTE ONLY): 28 min   Charges:   PT Evaluation $PT Eval Low Complexity: 1 Low PT Treatments $Gait Training: 8-22 mins        Delice Bison,  PT  Acute Rehab Dept ([facility]) [phone] Pager [phone]  04/18/2021   [facility] 04/18/2021, 5:15 PM

## 2021-04-18 NOTE — Transfer of Care (Signed)
Immediate Anesthesia Transfer of Care Note  Patient: QUINCE SANTANA  Procedure(s) Performed: TOTAL HIP ARTHROPLASTY ANTERIOR APPROACH (Left: Hip)  Patient Location: PACU  Anesthesia Type:Spinal  Level of Consciousness: awake, alert  and oriented  Airway & Oxygen Therapy: Patient Spontanous Breathing and Patient connected to face mask oxygen  Post-op Assessment: Report given to RN and Post -op Vital signs reviewed and stable  Post vital signs: Reviewed and stable  Last Vitals:  Vitals Value Taken Time  BP 149/63 04/18/21 1345  Temp 36.7 C 04/18/21 1345  Pulse 89 04/18/21 1348  Resp 15 04/18/21 1348  SpO2 99 % 04/18/21 1348  Vitals shown include unvalidated device data.  Last Pain:  Vitals:   04/18/21 0930  TempSrc:   PainSc: 3       Patients Stated Pain Goal: 2 (04/18/21 0930)  Complications: No notable events documented.

## 2021-04-18 NOTE — Anesthesia Postprocedure Evaluation (Signed)
Anesthesia Post Note  Patient: Eddie Lewis  Procedure(s) Performed: TOTAL HIP ARTHROPLASTY ANTERIOR APPROACH (Left: Hip)     Patient location during evaluation: PACU Anesthesia Type: Spinal Level of consciousness: oriented and awake and alert Pain management: pain level controlled Vital Signs Assessment: post-procedure vital signs reviewed and stable Respiratory status: spontaneous breathing, respiratory function stable and patient connected to nasal cannula oxygen Cardiovascular status: blood pressure returned to baseline and stable Postop Assessment: no headache, no backache and no apparent nausea or vomiting Anesthetic complications: no   No notable events documented.  Last Vitals:  Vitals:   04/18/21 1500 04/18/21 1517  BP: 112/73 125/89  Pulse: 75 70  Resp: 19 16  Temp: (!) 36.3 C 36.4 C  SpO2: 91% 92%    Last Pain:  Vitals:   04/18/21 1517  TempSrc: Oral  PainSc: 2                  Brennley Curtice S

## 2021-04-18 NOTE — Interval H&P Note (Signed)
History and Physical Interval Note:  04/18/2021 10:13 AM  Eddie Lewis  has presented today for surgery, with the diagnosis of Left hip osteoarthritis.  The various methods of treatment have been discussed with the patient and family. After consideration of risks, benefits and other options for treatment, the patient has consented to  Procedure(s): TOTAL HIP ARTHROPLASTY ANTERIOR APPROACH (Left) as a surgical intervention.  The patient's history has been reviewed, patient examined, no change in status, stable for surgery.  I have reviewed the patient's chart and labs.  Questions were answered to the patient's satisfaction.     Iline Oven Osiah Haring

## 2021-04-18 NOTE — Anesthesia Procedure Notes (Signed)
Spinal  Patient location during procedure: OR Start time: 04/18/2021 11:41 AM End time: 04/18/2021 11:45 AM Reason for block: surgical anesthesia Staffing Performed: anesthesiologist  Anesthesiologist: Eilene Ghazi, MD Preanesthetic Checklist Completed: patient identified, IV checked, site marked, risks and benefits discussed, surgical consent, monitors and equipment checked, pre-op evaluation and timeout performed Spinal Block Patient position: sitting Prep: Betadine Patient monitoring: heart rate, continuous pulse ox and blood pressure Approach: midline Location: L3-4 Injection technique: single-shot Needle Needle type: Sprotte  Needle gauge: 24 G Needle length: 9 cm Assessment Sensory level: T6 Events: CSF return Additional Notes

## 2021-04-19 DIAGNOSIS — M1612 Unilateral primary osteoarthritis, left hip: Secondary | ICD-10-CM | POA: Diagnosis not present

## 2021-04-19 LAB — CBC
HCT: 34.1 % — ABNORMAL LOW (ref 39.0–52.0)
Hemoglobin: 10.9 g/dL — ABNORMAL LOW (ref 13.0–17.0)
MCH: 29.1 pg (ref 26.0–34.0)
MCHC: 32 g/dL (ref 30.0–36.0)
MCV: 91.2 fL (ref 80.0–100.0)
Platelets: 157 10*3/uL (ref 150–400)
RBC: 3.74 MIL/uL — ABNORMAL LOW (ref 4.22–5.81)
RDW: 13.4 % (ref 11.5–15.5)
WBC: 10.2 10*3/uL (ref 4.0–10.5)
nRBC: 0 % (ref 0.0–0.2)

## 2021-04-19 LAB — BASIC METABOLIC PANEL
Anion gap: 8 (ref 5–15)
BUN: 23 mg/dL — ABNORMAL HIGH (ref 6–20)
CO2: 23 mmol/L (ref 22–32)
Calcium: 8.1 mg/dL — ABNORMAL LOW (ref 8.9–10.3)
Chloride: 106 mmol/L (ref 98–111)
Creatinine, Ser: 0.84 mg/dL (ref 0.61–1.24)
GFR, Estimated: >60 mL/min (ref >60)
Glucose, Bld: 138 mg/dL — ABNORMAL HIGH (ref 70–99)
Potassium: 3.9 mmol/L (ref 3.5–5.1)
Sodium: 137 mmol/L (ref 135–145)

## 2021-04-19 LAB — GLUCOSE, CAPILLARY: Glucose-Capillary: 151 mg/dL — ABNORMAL HIGH (ref 70–99)

## 2021-04-19 MED ORDER — DOCUSATE SODIUM 100 MG PO CAPS: 100.0000 mg | ORAL_CAPSULE | Freq: Two times a day (BID) | ORAL | 0 refills | Status: DC

## 2021-04-19 MED ORDER — ONDANSETRON HCL 4 MG PO TABS: 4.0000 mg | ORAL_TABLET | Freq: Four times a day (QID) | ORAL | 0 refills | Status: DC | PRN

## 2021-04-19 MED ORDER — SENNA 8.6 MG PO TABS: 2.0000 | ORAL_TABLET | Freq: Every day | ORAL | 0 refills | Status: DC

## 2021-04-19 MED ORDER — ASPIRIN 81 MG PO CHEW: 81.0000 mg | CHEWABLE_TABLET | Freq: Two times a day (BID) | ORAL | 1 refills | Status: AC

## 2021-04-19 MED ORDER — HYDROCODONE-ACETAMINOPHEN 5-325 MG PO TABS: 1.0000 | ORAL_TABLET | ORAL | 0 refills | Status: DC | PRN

## 2021-04-19 NOTE — Progress Notes (Signed)
° ° °  Subjective:  Patient reports pain as mild to moderate.  Denies N/V/CP/SOB. Eager to go home.  Objective:   VITALS:   Vitals:   04/18/21 1715 04/18/21 2119 04/19/21 0153 04/19/21 0442  BP: 99/64 93/60 102/63 108/64  Pulse: 89 84 85 89  Resp: 18 17 17 17   Temp: 99.1 F (37.3 C) 98.8 F (37.1 C) 99.5 F (37.5 C) (!) 100.4 F (38 C)  TempSrc: Oral Oral Oral Oral  SpO2: 92% 93% 90% 96%  Weight:      Height:        NAD ABD soft Sensation intact distally Intact pulses distally Dorsiflexion/Plantar flexion intact Incision: dressing C/D/I Compartment soft   Lab Results  Component Value Date   WBC 10.2 04/19/2021   HGB 10.9 (L) 04/19/2021   HCT 34.1 (L) 04/19/2021   MCV 91.2 04/19/2021   PLT 157 04/19/2021   BMET    Component Value Date/Time   NA 137 04/19/2021 0243   K 3.9 04/19/2021 0243   CL 106 04/19/2021 0243   CO2 23 04/19/2021 0243   GLUCOSE 138 (H) 04/19/2021 0243   BUN 23 (H) 04/19/2021 0243   CREATININE 0.84 04/19/2021 0243   CALCIUM 8.1 (L) 04/19/2021 0243   GFRNONAA >60 04/19/2021 0243     Assessment/Plan: 1 Day Post-Op   Principal Problem:   Osteoarthritis of left hip   WBAT with walker DVT ppx: Aspirin, SCDs, TEDS PO pain control PT/OT Dispo: D/C home with HEP after clears PT   Eddie Lewis Eddie Lewis 04/19/2021, 7:48 AM   Rod Can, MD [phone] [facility] now [facility]   [place] [address], [address], [place], [place] [address] Phone: [phone] [email or web address]   [employer]

## 2021-04-19 NOTE — TOC Transition Note (Signed)
Transition of Care [facility]) - CM/SW Discharge Note   Patient Details  Name: Eddie Lewis MRN: [id number] Date of Birth: 02-03-1961  Transition of Care [facility]) CM/SW Contact:  Lennart Pall, LCSW Phone Number: 04/19/2021, 10:10 AM   Clinical Narrative:    Met with pt and confirming he has all needed DME at home.  Plan HEP. No TOC needs.   Final next level of care: Home/Self Care Barriers to Discharge: No Barriers Identified   Patient Goals and CMS Choice Patient states their goals for this hospitalization and ongoing recovery are:: return home      Discharge Placement                       Discharge Plan and Services                DME Arranged: N/A DME Agency: NA                  Social Determinants of Health (SDOH) Interventions     Readmission Risk Interventions No flowsheet data found.

## 2021-04-19 NOTE — Plan of Care (Signed)
Problem: Education: Goal: Knowledge of General Education information will improve Description: Including pain rating scale, medication(s)/side effects and non-pharmacologic comfort measures Outcome: Progressing   Problem: Health Behavior/Discharge Planning: Goal: Ability to manage health-related needs will improve Outcome: Progressing   Problem: Clinical Measurements: Goal: Ability to maintain clinical measurements within normal limits will improve Outcome: Naomi V Tryton Bodi, RN 04/19/21 9:15 AM

## 2021-04-19 NOTE — Plan of Care (Signed)

## 2021-04-19 NOTE — Progress Notes (Signed)
Physical Therapy Treatment Patient Details Name: Eddie Lewis MRN: [id number] DOB: [birth year] Today's Date: 04/19/2021   History of Present Illness 61 yo male s/p L DA THA. PMH: CABG, HTN, OA, DM, MI    PT Comments    The patient is progressing, achieved PT goals.   Recommendations for follow up therapy are one component of a multi-disciplinary discharge planning process, led by the attending physician.  Recommendations may be updated based on patient status, additional functional criteria and insurance authorization.  Follow Up Recommendations  Follow physician's recommendations for discharge plan and follow up therapies     Assistance Recommended at Discharge Intermittent Supervision/Assistance  Patient can return home with the following Help with stairs or ramp for entrance   Equipment Recommendations  None recommended by PT    Recommendations for Other Services       Precautions / Restrictions Precautions Precautions: Fall     Mobility  Bed Mobility         Supine to sit: Supervision     General bed mobility comments: incr time, use of rail, cues for technique, use of belt    Transfers Overall transfer level: Needs assistance Equipment used: Rolling walker (2 wheels) Transfers: Sit to/from Stand Sit to Stand: Min guard           General transfer comment: cues for hand placement    Ambulation/Gait Ambulation/Gait assistance: Min guard Gait Distance (Feet): 90 Feet Assistive device: Rolling walker (2 wheels) Gait Pattern/deviations: Step-to pattern       General Gait Details: cues for sequence and RW position   Stairs Stairs: Yes Stairs assistance: Supervision Stair Management: Two rails Number of Stairs: 2 General stair comments: cues for sequence   Wheelchair Mobility    Modified Rankin (Stroke Patients Only)       Balance                                            Cognition Arousal/Alertness:  Awake/alert                                              Exercises Total Joint Exercises Ankle Circles/Pumps: AROM Quad Sets: AROM, Both, 10 reps Short Arc Quad: AROM, Left, 10 reps Heel Slides: AAROM, Left, 10 reps Hip ABduction/ADduction: AAROM, Left, 10 reps    General Comments        Pertinent Vitals/Pain Pain Assessment Faces Pain Scale: Hurts little more Pain Location: L hip Pain Descriptors / Indicators: Sore, Aching, Grimacing Pain Intervention(s): Monitored during session, Premedicated before session    Home Living                          Prior Function            PT Goals (current goals can now be found in the care plan section) Progress towards PT goals: Progressing toward goals    Frequency    7X/week      PT Plan Current plan remains appropriate    Co-evaluation              AM-PAC PT "6 Clicks" Mobility   Outcome Measure  Help needed turning from your back to your side while in a flat  bed without using bedrails?: A Little Help needed moving from lying on your back to sitting on the side of a flat bed without using bedrails?: A Little Help needed moving to and from a bed to a chair (including a wheelchair)?: A Little Help needed standing up from a chair using your arms (e.g., wheelchair or bedside chair)?: A Little Help needed to walk in hospital room?: A Little Help needed climbing 3-5 steps with a railing? : A Little 6 Click Score: 18    End of Session Equipment Utilized During Treatment: Gait belt Activity Tolerance: Patient tolerated treatment well Patient left: with call bell/phone within reach;in chair;with family/visitor present Nurse Communication: Mobility status PT Visit Diagnosis: Other abnormalities of gait and mobility (R26.89);Difficulty in walking, not elsewhere classified (R26.2)     Time: 1019-1040 PT Time Calculation (min) (ACUTE ONLY): 21 min  Charges:  $Gait Training: 8-22  mins                     Blanchard Kelch PT Acute Rehabilitation Services Pager [phone] Office [phone]    Rada Hay 04/19/2021, 1:34 PM

## 2021-04-19 NOTE — Plan of Care (Signed)
Patient discharged home with wife via private vehicle. Ivan Anchors, RN 04/19/21 12:00 PM

## 2021-04-19 NOTE — Discharge Summary (Signed)
Physician Discharge Summary  Patient ID: Eddie Lewis MRN: [id number] DOB/AGE: September 11, 1960 61 y.o.  Admit date: 04/18/2021 Discharge date: 04/19/2021  Admission Diagnoses:  Osteoarthritis of left hip  Discharge Diagnoses:  Principal Problem:   Osteoarthritis of left hip   Past Medical History:  Diagnosis Date   Arthritis    BMI 32.0-32.9,adult    CAD (coronary artery disease)    Diabetes mellitus without complication (HCC)    Hypertension    Kidney stones 2006   Myocardial infarction [facility])    Pneumonia     Surgeries: Procedure(s): TOTAL HIP ARTHROPLASTY ANTERIOR APPROACH on 04/18/2021   Consultants (if any):   Discharged Condition: Improved  Hospital Course: Eddie Lewis is an 60 y.o. male who was admitted 04/18/2021 with a diagnosis of Osteoarthritis of left hip and went to the operating room on 04/18/2021 and underwent the above named procedures.    He was given perioperative antibiotics:  Anti-infectives (From admission, onward)    Start     Dose/Rate Route Frequency Ordered Stop   04/18/21 1800  ceFAZolin (ANCEF) IVPB 2g/100 mL premix        2 g 200 mL/hr over 30 Minutes Intravenous Every 6 hours 04/18/21 1514 04/19/21 0119   04/18/21 0915  ceFAZolin (ANCEF) IVPB 2g/100 mL premix        2 g 200 mL/hr over 30 Minutes Intravenous On call to O.R. 04/18/21 8110 04/18/21 1216     .  He was given sequential compression devices, early ambulation, and ASA for DVT prophylaxis.  He benefited maximally from the hospital stay and there were no complications.    Recent vital signs:  Vitals:   04/19/21 0153 04/19/21 0442  BP: 102/63 108/64  Pulse: 85 89  Resp: 17 17  Temp: 99.5 F (37.5 C) (!) 100.4 F (38 C)  SpO2: 90% 96%    Recent laboratory studies:  Lab Results  Component Value Date   HGB 10.9 (L) 04/19/2021   HGB 14.7 04/09/2021   HGB 13.7 03/19/2021   Lab Results  Component Value Date   WBC 10.2 04/19/2021   PLT 157 04/19/2021   Lab Results   Component Value Date   INR 1.0 03/19/2021   Lab Results  Component Value Date   NA 137 04/19/2021   K 3.9 04/19/2021   CL 106 04/19/2021   CO2 23 04/19/2021   BUN 23 (H) 04/19/2021   CREATININE 0.84 04/19/2021   GLUCOSE 138 (H) 04/19/2021     WEIGHT BEARING   Weight bearing as tolerated with assist device (walker, cane, etc) as directed, use it as long as suggested by your surgeon or therapist, typically at least 4-6 weeks.   EXERCISES  Results after joint replacement surgery are often greatly improved when you follow the exercise, range of motion and muscle strengthening exercises prescribed by your doctor. Safety measures are also important to protect the joint from further injury. Any time any of these exercises cause you to have increased pain or swelling, decrease what you are doing until you are comfortable again and then slowly increase them. If you have problems or questions, call your caregiver or physical therapist for advice.   Rehabilitation is important following a joint replacement. After just a few days of immobilization, the muscles of the leg can become weakened and shrink (atrophy).  These exercises are designed to build up the tone and strength of the thigh and leg muscles and to improve motion. Often times heat used for twenty to  thirty minutes before working out will loosen up your tissues and help with improving the range of motion but do not use heat for the first two weeks following surgery (sometimes heat can increase post-operative swelling).   These exercises can be done on a training (exercise) mat, on the floor, on a table or on a bed. Use whatever works the best and is most comfortable for you.    Use music or television while you are exercising so that the exercises are a pleasant break in your day. This will make your life better with the exercises acting as a break in your routine that you can look forward to.   Perform all exercises about fifteen times,  three times per day or as directed.  You should exercise both the operative leg and the other leg as well.  Exercises include:   Quad Sets - Tighten up the muscle on the front of the thigh (Quad) and hold for 5-10 seconds.   Straight Leg Raises - With your knee straight (if you were given a brace, keep it on), lift the leg to 60 degrees, hold for 3 seconds, and slowly lower the leg.  Perform this exercise against resistance later as your leg gets stronger.  Leg Slides: Lying on your back, slowly slide your foot toward your buttocks, bending your knee up off the floor (only go as far as is comfortable). Then slowly slide your foot back down until your leg is flat on the floor again.  Angel Wings: Lying on your back spread your legs to the side as far apart as you can without causing discomfort.  Hamstring Strength:  Lying on your back, push your heel against the floor with your leg straight by tightening up the muscles of your buttocks.  Repeat, but this time bend your knee to a comfortable angle, and push your heel against the floor.  You may put a pillow under the heel to make it more comfortable if necessary.   A rehabilitation program following joint replacement surgery can speed recovery and prevent re-injury in the future due to weakened muscles. Contact your doctor or a physical therapist for more information on knee rehabilitation.    CONSTIPATION  Constipation is defined medically as fewer than three stools per week and severe constipation as less than one stool per week.  Even if you have a regular bowel pattern at home, your normal regimen is likely to be disrupted due to multiple reasons following surgery.  Combination of anesthesia, postoperative narcotics, change in appetite and fluid intake all can affect your bowels.   YOU MUST use at least one of the following options; they are listed in order of increasing strength to get the job done.  They are all available over the counter, and  you may need to use some, POSSIBLY even all of these options:    Drink plenty of fluids (prune juice may be helpful) and high fiber foods Colace 100 mg by mouth twice a day  Senokot for constipation as directed and as needed Dulcolax (bisacodyl), take with full glass of water  Miralax (polyethylene glycol) once or twice a day as needed.  If you have tried all these things and are unable to have a bowel movement in the first 3-4 days after surgery call either your surgeon or your primary doctor.    If you experience loose stools or diarrhea, hold the medications until you stool forms back up.  If your symptoms do not get better  within 1 week or if they get worse, check with your doctor.  If you experience "the worst abdominal pain ever" or develop nausea or vomiting, please contact the office immediately for further recommendations for treatment.   ITCHING:  If you experience itching with your medications, try taking only a single pain pill, or even half a pain pill at a time.  You can also use Benadryl over the counter for itching or also to help with sleep.   TED HOSE STOCKINGS:  Use stockings on both legs until for at least 2 weeks or as directed by physician office. They may be removed at night for sleeping.  MEDICATIONS:  See your medication summary on the After Visit Summary that nursing will review with you.  You may have some home medications which will be placed on hold until you complete the course of blood thinner medication.  It is important for you to complete the blood thinner medication as prescribed.  PRECAUTIONS:  If you experience chest pain or shortness of breath - call 911 immediately for transfer to the hospital emergency department.   If you develop a fever greater that 101 F, purulent drainage from wound, increased redness or drainage from wound, foul odor from the wound/dressing, or calf pain - CONTACT YOUR SURGEON.                                                    FOLLOW-UP APPOINTMENTS:  If you do not already have a post-op appointment, please call the office for an appointment to be seen by your surgeon.  Guidelines for how soon to be seen are listed in your After Visit Summary, but are typically between 1-4 weeks after surgery.  OTHER INSTRUCTIONS:   Knee Replacement:  Do not place pillow under knee, focus on keeping the knee straight while resting. CPM instructions: 0-90 degrees, 2 hours in the morning, 2 hours in the afternoon, and 2 hours in the evening. Place foam block, curve side up under heel at all times except when in CPM or when walking.  DO NOT modify, tear, cut, or change the foam block in any way.   MAKE SURE YOU:  Understand these instructions.  Get help right away if you are not doing well or get worse.    Thank you for letting us be a part of your medical care team.  It is a privilege we respect greatly.  We hope these instructions will help you stay on track for a fast and full recovery!   Diagnostic Studies: DG Pelvis Portable  Result Date: 04/18/2021 CLINICAL DATA:  Postop left hip replacement EXAM: PORTABLE PELVIS 1-2 VIEWS COMPARISON:  None. FINDINGS: Left hip replacement in satisfactory position and alignment. No fracture or complication. Negative right hip. IMPRESSION: Satisfactory left hip replacement Electronically Signed   By: Marlan Palauharles  Clark M.D.   On: 04/18/2021 14:33   DG C-Arm 1-60 Min-No Report  Result Date: 04/18/2021 Fluoroscopy was utilized by the requesting physician.  No radiographic interpretation.   DG HIP OPERATIVE UNILAT W OR W/O PELVIS LEFT  Result Date: 04/18/2021 CLINICAL DATA:  Left hip replacement. EXAM: OPERATIVE left HIP (WITH PELVIS IF PERFORMED) 4 VIEWS TECHNIQUE: Fluoroscopic spot image(s) were submitted for interpretation post-operatively. COMPARISON:  None. FINDINGS: Left hip replacement in satisfactory position alignment. No fracture or complication. IMPRESSION: Satisfactory left hip  replacement. Electronically Signed   By: Marlan Palau M.D.   On: 04/18/2021 14:34    Disposition: Discharge disposition: 01-Home or Self Care       Discharge Instructions     Call MD / Call 911   Complete by: As directed    If you experience chest pain or shortness of breath, CALL 911 and be transported to the hospital emergency room.  If you develope a fever above 101 F, pus (white drainage) or increased drainage or redness at the wound, or calf pain, call your surgeon's office.   Constipation Prevention   Complete by: As directed    Drink plenty of fluids.  Prune juice may be helpful.  You may use a stool softener, such as Colace (over the counter) 100 mg twice a day.  Use MiraLax (over the counter) for constipation as needed.   Diet - low sodium heart healthy   Complete by: As directed    Driving restrictions   Complete by: As directed    No driving for 6 weeks   Increase activity slowly as tolerated   Complete by: As directed    Lifting restrictions   Complete by: As directed    No lifting for 6 weeks   Post-operative opioid taper instructions:   Complete by: As directed    POST-OPERATIVE OPIOID TAPER INSTRUCTIONS: It is important to wean off of your opioid medication as soon as possible. If you do not need pain medication after your surgery it is ok to stop day one. Opioids include: Codeine, Hydrocodone(Norco, Vicodin), Oxycodone(Percocet, oxycontin) and hydromorphone amongst others.  Long term and even short term use of opiods can cause: Increased pain response Dependence Constipation Depression Respiratory depression And more.  Withdrawal symptoms can include Flu like symptoms Nausea, vomiting And more Techniques to manage these symptoms Hydrate well Eat regular healthy meals Stay active Use relaxation techniques(deep breathing, meditating, yoga) Do Not substitute Alcohol to help with tapering If you have been on opioids for less than two weeks and do not  have pain than it is ok to stop all together.  Plan to wean off of opioids This plan should start within one week post op of your joint replacement. Maintain the same interval or time between taking each dose and first decrease the dose.  Cut the total daily intake of opioids by one tablet each day Next start to increase the time between doses. The last dose that should be eliminated is the evening dose.      TED hose   Complete by: As directed    Use stockings (TED hose) for 2 weeks on both leg(s).  You may remove them at night for sleeping.        Follow-up Information     Perina Salvaggio, Arlys John, MD. Schedule an appointment as soon as possible for a visit in 2 week(s).   Specialty: Orthopedic Surgery Why: For wound re-check Contact information: [address] [place] [address] [phone]                  Signed: Iline Oven Amarrah Meinhart 04/19/2021, 7:53 AM

## 2021-04-24 ENCOUNTER — Encounter (HOSPITAL_COMMUNITY): Payer: Self-pay | Admitting: Orthopedic Surgery

## 2021-12-24 ENCOUNTER — Encounter: Payer: Self-pay | Admitting: *Deleted

## 2022-03-14 ENCOUNTER — Ambulatory Visit
Admission: EM | Admit: 2022-03-14 | Discharge: 2022-03-14 | Disposition: A | Discharge status: Home / Self Care | Payer: No Typology Code available for payment source | Attending: Nurse Practitioner | Admitting: Nurse Practitioner

## 2022-03-14 ENCOUNTER — Encounter: Payer: Self-pay | Admitting: Emergency Medicine

## 2022-03-14 ENCOUNTER — Encounter: Payer: Self-pay | Admitting: *Deleted

## 2022-03-14 DIAGNOSIS — J069 Acute upper respiratory infection, unspecified: Secondary | ICD-10-CM

## 2022-03-14 DIAGNOSIS — R6889 Other general symptoms and signs: Secondary | ICD-10-CM | POA: Diagnosis not present

## 2022-03-14 DIAGNOSIS — Z20828 Contact with and (suspected) exposure to other viral communicable diseases: Secondary | ICD-10-CM

## 2022-03-14 MED ORDER — BENZONATATE 100 MG PO CAPS: 100.0000 mg | ORAL_CAPSULE | Freq: Three times a day (TID) | ORAL | 0 refills | Status: DC | PRN

## 2022-03-14 MED ORDER — ALBUTEROL SULFATE HFA 108 (90 BASE) MCG/ACT IN AERS: 1.0000 | INHALATION_SPRAY | Freq: Four times a day (QID) | RESPIRATORY_TRACT | 0 refills | Status: DC | PRN

## 2022-03-14 MED ORDER — ALBUTEROL SULFATE (2.5 MG/3ML) 0.083% IN NEBU
2.5000 mg | INHALATION_SOLUTION | Freq: Once | RESPIRATORY_TRACT | Status: AC
  Administered 2022-03-14: 2.5 mg via RESPIRATORY_TRACT

## 2022-03-14 MED ORDER — OSELTAMIVIR PHOSPHATE 75 MG PO CAPS: 75.0000 mg | ORAL_CAPSULE | Freq: Two times a day (BID) | ORAL | 0 refills | Status: DC

## 2022-03-14 NOTE — ED Provider Notes (Signed)
[facility]    CSN: [id number] Arrival date & time: 03/14/22  [id number]      History   Chief Complaint No chief complaint on file.   HPI Eddie Lewis is a 61 y.o. male.   Patient presents today for 2 days of fever, dry and productive cough, chest tightness, chest pain after coughing, chest and nasal congestion, sore throat, headache, decreased appetite, and fatigue.  He denies shortness of breath or chest pain at rest.  No ear pain, abdominal pain, nausea/vomiting, diarrhea, or loss of taste/smell.  Reports his wife recently tested positive for flu when he started with symptoms a couple days later.  He has taken over-the-counter congestion medicine as well as Tylenol for symptoms with minimal benefit.  Patient denies history of chronic lung disease.  He does endorse a history of multiple heart attacks and is currently following with cardiologist closely.  He is taking his medications as prescribed.  Reports he quit smoking a few years ago, reports he was told he has a "touch" of asthma a few years ago.  Does not currently have an albuterol inhaler.    Past Medical History:  Diagnosis Date   Arthritis    BMI 32.0-32.9,adult    CAD (coronary artery disease)    Diabetes mellitus without complication ([place])    Hypertension    Kidney stones 2006   Myocardial infarction [facility])    Pneumonia     Patient Active Problem List   Diagnosis Date Noted   Osteoarthritis of left hip 04/18/2021   Pre-operative general physical examination 03/19/2021   Chronic left hip pain 03/19/2021   Erectile dysfunction 03/19/2021   CAD (coronary artery disease)    BMI 32.0-32.9,adult    STEMI (ST elevation myocardial infarction) ([place]) 03/18/2018   Coronary artery disease involving native coronary artery of native heart with unstable angina pectoris ([place]) 03/17/2018   Acute ST elevation myocardial infarction (STEMI) of inferolateral wall ([place]) 03/17/2018   Acute combined systolic and diastolic  heart failure (HCC) - DHF noted, EF not known 03/17/2018   Essential hypertension 03/17/2018   Hyperlipidemia with target LDL less than 70 03/17/2018   Chest pain 03/04/2014   Unstable angina ([place]) 03/04/2014    Past Surgical History:  Procedure Laterality Date   CARDIAC CATHETERIZATION     CORONARY STENT PLACEMENT  11/28/2011   2nd stent on 12/03/11   CORONARY/GRAFT ACUTE MI REVASCULARIZATION N/A 03/17/2018   Procedure: Coronary/Graft Acute MI Revascularization;  Surgeon: Leonie Man, MD;  Location: [place] CV [facility];  Service: Cardiovascular;  Laterality: N/A;   LEFT AND RIGHT HEART CATHETERIZATION WITH CORONARY ANGIOGRAM  11/29/2011   Procedure: LEFT AND RIGHT HEART CATHETERIZATION WITH CORONARY ANGIOGRAM;  Surgeon: Clent Demark, MD;  Location: [place] CATH LAB;  Service: Cardiovascular;;   LEFT HEART CATH AND CORONARY ANGIOGRAPHY N/A 03/17/2018   Procedure: LEFT HEART CATH AND CORONARY ANGIOGRAPHY;  Surgeon: Leonie Man, MD;  Location: [place] [facility];  Service: Cardiovascular;  Laterality: N/A;   LEFT HEART CATHETERIZATION WITH CORONARY ANGIOGRAM N/A 12/03/2011   Procedure: LEFT HEART CATHETERIZATION WITH CORONARY ANGIOGRAM;  Surgeon: Clent Demark, MD;  Location: [place] CATH LAB;  Service: Cardiovascular;  Laterality: N/A;   NO PAST SURGERIES     PERCUTANEOUS CORONARY STENT INTERVENTION (PCI-S)  11/29/2011   Procedure: PERCUTANEOUS CORONARY STENT INTERVENTION (PCI-S);  Surgeon: Clent Demark, MD;  Location: [facility] CATH LAB;  Service: Cardiovascular;;   TOTAL HIP ARTHROPLASTY Left 04/18/2021   Procedure: TOTAL  HIP ARTHROPLASTY ANTERIOR APPROACH;  Surgeon: Rod Can, MD;  Location: [facility];  Service: Orthopedics;  Laterality: Left;   TRACHEOSTOMY         Home Medications    Prior to Admission medications   Medication Sig Start Date End Date Taking? Authorizing Provider  albuterol (VENTOLIN HFA) 108 (90 Base) MCG/ACT inhaler Inhale 1-2 puffs into the lungs every 6  (six) hours as needed for wheezing or shortness of breath. 03/14/22  Yes Eulogio Bear, NP  benzonatate (TESSALON) 100 MG capsule Take 1 capsule (100 mg total) by mouth 3 (three) times daily as needed for cough. Do not take with alcohol or while driving or operating heavy machinery.  May cause drowsiness. 03/14/22  Yes Eulogio Bear, NP  oseltamivir (TAMIFLU) 75 MG capsule Take 1 capsule (75 mg total) by mouth 2 (two) times daily. 03/14/22  Yes Eulogio Bear, NP  atorvastatin (LIPITOR) 80 MG tablet Take 1 tablet (80 mg total) by mouth daily with breakfast. 03/06/14   Charolette Forward, MD  docusate sodium (COLACE) 100 MG capsule Take 1 capsule (100 mg total) by mouth 2 (two) times daily. 04/19/21   Swinteck, Aaron Edelman, MD  FARXIGA 10 MG TABS tablet Take 10 mg by mouth daily. 12/26/20   [provider]  HYDROcodone-acetaminophen (NORCO/VICODIN) 5-325 MG tablet Take 1 tablet by mouth every 4 (four) hours as needed for moderate pain. 04/19/21   Swinteck, Aaron Edelman, MD  losartan (COZAAR) 100 MG tablet Take 100 mg by mouth daily. 12/25/20   [provider]  meloxicam (MOBIC) 7.5 MG tablet Take 7.5 mg by mouth daily. 01/11/21   [provider]  metFORMIN (GLUCOPHAGE-XR) 500 MG 24 hr tablet Take 1 tablet (500 mg total) by mouth in the morning and at bedtime. 03/23/21 06/21/21  Jeanie Sewer, NP  metoprolol succinate (TOPROL-XL) 25 MG 24 hr tablet Take 25 mg by mouth daily.    [provider]  nitroGLYCERIN (NITROSTAT) 0.4 MG SL tablet Place 1 tablet (0.4 mg total) under the tongue every 5 (five) minutes x 3 doses as needed for chest pain. 03/06/14 03/30/21  Charolette Forward, MD  ondansetron (ZOFRAN) 4 MG tablet Take 1 tablet (4 mg total) by mouth every 6 (six) hours as needed for nausea. 04/19/21   Swinteck, Aaron Edelman, MD  senna (SENOKOT) 8.6 MG TABS tablet Take 2 tablets (17.2 mg total) by mouth at bedtime. 04/19/21   Swinteck, Aaron Edelman, MD    Family History Family History   Problem Relation Age of Onset   Other Father     Social History Social History   Tobacco Use   Smoking status: Every Day    Packs/day: 1.00    Years: 33.00    Total pack years: 33.00    Types: Cigarettes    Last attempt to quit: 11/27/2011    Years since quitting: 10.3   Smokeless tobacco: Never  Vaping Use   Vaping Use: Never used  Substance Use Topics   Alcohol use: Yes    Alcohol/week: 1.0 standard drink of alcohol    Types: 1 Cans of beer per week    Comment: occ   Drug use: No     Allergies   Codeine   Review of Systems Review of Systems Per HPI  Physical Exam Triage Vital Signs ED Triage Vitals  Enc Vitals Group     BP 03/14/22 1022 124/72     Pulse Rate 03/14/22 1022 85     Resp 03/14/22 1022 20  Temp 03/14/22 1022 99.4 F (37.4 C)     Temp Source 03/14/22 1022 Oral     SpO2 03/14/22 1022 91 %     Weight --      Height --      Head Circumference --      Peak Flow --      Pain Score 03/14/22 1024 3     Pain Loc --      Pain Edu? --      Excl. in GC? --    No data found.  Updated Vital Signs BP 124/72 (BP Location: Right Arm)   Pulse 88   Temp 99.4 F (37.4 C) (Oral)   Resp 20   SpO2 97%   Visual Acuity Right Eye Distance:   Left Eye Distance:   Bilateral Distance:    Right Eye Near:   Left Eye Near:    Bilateral Near:     Physical Exam Vitals and nursing note reviewed.  Constitutional:      General: He is not in acute distress.    Appearance: Normal appearance. He is not ill-appearing or toxic-appearing.  HENT:     Head: Normocephalic and atraumatic.     Right Ear: Tympanic membrane, ear canal and external ear normal.     Left Ear: Tympanic membrane, ear canal and external ear normal.     Nose: Congestion and rhinorrhea present.     Mouth/Throat:     Mouth: Mucous membranes are moist.     Pharynx: Oropharynx is clear. Posterior oropharyngeal erythema present. No oropharyngeal exudate.  Eyes:     General: No scleral  icterus.    Extraocular Movements: Extraocular movements intact.  Cardiovascular:     Rate and Rhythm: Normal rate and regular rhythm.     Heart sounds: Normal heart sounds. No murmur heard. Pulmonary:     Effort: Pulmonary effort is normal. No respiratory distress.     Breath sounds: Decreased breath sounds present. No wheezing, rhonchi or rales.  Abdominal:     General: Abdomen is flat. Bowel sounds are normal. There is no distension.     Palpations: Abdomen is soft.     Tenderness: There is no abdominal tenderness.  Musculoskeletal:     Cervical back: Normal range of motion and neck supple.  Lymphadenopathy:     Cervical: No cervical adenopathy.  Skin:    General: Skin is warm and dry.     Coloration: Skin is not jaundiced or pale.     Findings: No erythema or rash.  Neurological:     Mental Status: He is alert and oriented to person, place, and time.  Psychiatric:        Behavior: Behavior is cooperative.      UC Treatments / Results  Labs (all labs ordered are listed, but only abnormal results are displayed) Labs Reviewed - No data to display  EKG   Radiology No results found.  Procedures Procedures (including critical care time)  Medications Ordered in UC Medications  albuterol (PROVENTIL) (2.5 MG/3ML) 0.083% nebulizer solution 2.5 mg (2.5 mg Nebulization Given 03/14/22 1044)    Initial Impression / Assessment and Plan / UC Course  I have reviewed the triage vital signs and the nursing notes.  Pertinent labs & imaging results that were available during my care of the patient were reviewed by me and considered in my medical decision making (see chart for details).   Patient is well-appearing, normotensive, afebrile, not tachycardic, and not tachypneic.  Initially in  triage, he is oxygenating 91% on room air.  After albuterol breathing treatment, oxygenation increased to 97% on room air.  Exposure to influenza Flu-like symptoms Viral URI with cough Viral  testing deferred at this time as he has a known contact with influenza and is having similar symptoms Treat empirically with Tamiflu twice daily for 5 days Other supportive care discussed with patient Given decreased breath sounds, smoking history, albuterol nebulizer given with resolved chest tightness Start albuterol inhaler every 4-6 hours as needed at home Start cough suppressant ER and return precautions discussed Note given for work  The patient was given the opportunity to ask questions.  All questions answered to their satisfaction.  The patient is in agreement to this plan.    Final Clinical Impressions(s) / UC Diagnoses   Final diagnoses:  Exposure to influenza  Flu-like symptoms  Viral URI with cough     Discharge Instructions      I suspect you have influenza since your wife tested positive and you have similar symptoms.  We are treating you for influenza with Tamiflu twice daily for 5 days.  We have given you a breathing treatment today which helped open up your airway.  You can continue the albuterol at home every 4-6 hours as needed for wheezing or shortness of breath.  You can also start Tessalon Perles to help with a dry cough.  Some other things that can make you feel better are: - Coricidin products (safe for heart) - Increased rest - Increasing fluid with water/sugar free electrolytes - Acetaminophen and ibuprofen as needed for fever/pain - Salt water gargling, chloraseptic spray and throat lozenges for sore throat - OTC guaifenesin (Mucinex) 600 mg twice daily for congestion - Saline sinus flushes or a neti pot for congestion - Humidifying the air     ED Prescriptions     Medication Sig Dispense Auth. Provider   albuterol (VENTOLIN HFA) 108 (90 Base) MCG/ACT inhaler Inhale 1-2 puffs into the lungs every 6 (six) hours as needed for wheezing or shortness of breath. 18 g Noemi Chapel A, NP   oseltamivir (TAMIFLU) 75 MG capsule Take 1 capsule (75 mg  total) by mouth 2 (two) times daily. 10 capsule Noemi Chapel A, NP   benzonatate (TESSALON) 100 MG capsule Take 1 capsule (100 mg total) by mouth 3 (three) times daily as needed for cough. Do not take with alcohol or while driving or operating heavy machinery.  May cause drowsiness. 21 capsule Eulogio Bear, NP      PDMP not reviewed this encounter.   Eulogio Bear, NP 03/14/22 1059

## 2022-03-14 NOTE — Discharge Instructions (Signed)
I suspect you have influenza since your wife tested positive and you have similar symptoms.  We are treating you for influenza with Tamiflu twice daily for 5 days.  We have given you a breathing treatment today which helped open up your airway.  You can continue the albuterol at home every 4-6 hours as needed for wheezing or shortness of breath.  You can also start Tessalon Perles to help with a dry cough.  Some other things that can make you feel better are: - Coricidin products (safe for heart) - Increased rest - Increasing fluid with water/sugar free electrolytes - Acetaminophen and ibuprofen as needed for fever/pain - Salt water gargling, chloraseptic spray and throat lozenges for sore throat - OTC guaifenesin (Mucinex) 600 mg twice daily for congestion - Saline sinus flushes or a neti pot for congestion - Humidifying the air

## 2022-03-14 NOTE — ED Triage Notes (Signed)
Exposed to flu from wife.  Body aches, fever, nasal and chest congestion since Tuesday.

## 2022-04-07 ENCOUNTER — Other Ambulatory Visit: Payer: Self-pay | Admitting: Nurse Practitioner

## 2023-03-06 ENCOUNTER — Ambulatory Visit (INDEPENDENT_AMBULATORY_CARE_PROVIDER_SITE_OTHER): Payer: No Typology Code available for payment source | Admitting: Family

## 2023-03-06 ENCOUNTER — Encounter: Payer: Self-pay | Admitting: Family

## 2023-03-06 VITALS — BP 118/72 | HR 72 | Temp 98.0°F | Ht 63.5 in | Wt 206.4 lb

## 2023-03-06 DIAGNOSIS — Z1211 Encounter for screening for malignant neoplasm of colon: Secondary | ICD-10-CM

## 2023-03-06 DIAGNOSIS — Z114 Encounter for screening for human immunodeficiency virus [HIV]: Secondary | ICD-10-CM

## 2023-03-06 DIAGNOSIS — Z13220 Encounter for screening for lipoid disorders: Secondary | ICD-10-CM

## 2023-03-06 DIAGNOSIS — E66812 Obesity, class 2: Secondary | ICD-10-CM | POA: Insufficient documentation

## 2023-03-06 DIAGNOSIS — Z1159 Encounter for screening for other viral diseases: Secondary | ICD-10-CM | POA: Diagnosis not present

## 2023-03-06 DIAGNOSIS — Z0001 Encounter for general adult medical examination with abnormal findings: Secondary | ICD-10-CM

## 2023-03-06 DIAGNOSIS — Z125 Encounter for screening for malignant neoplasm of prostate: Secondary | ICD-10-CM | POA: Diagnosis not present

## 2023-03-06 DIAGNOSIS — Z122 Encounter for screening for malignant neoplasm of respiratory organs: Secondary | ICD-10-CM

## 2023-03-06 DIAGNOSIS — Z0000 Encounter for general adult medical examination without abnormal findings: Secondary | ICD-10-CM

## 2023-03-06 DIAGNOSIS — Z23 Encounter for immunization: Secondary | ICD-10-CM

## 2023-03-06 DIAGNOSIS — E1169 Type 2 diabetes mellitus with other specified complication: Secondary | ICD-10-CM | POA: Diagnosis not present

## 2023-03-06 DIAGNOSIS — Z6835 Body mass index (BMI) 35.0-35.9, adult: Secondary | ICD-10-CM

## 2023-03-06 DIAGNOSIS — Z7984 Long term (current) use of oral hypoglycemic drugs: Secondary | ICD-10-CM

## 2023-03-06 LAB — PSA: PSA: 1.43 ng/mL (ref 0.10–4.00)

## 2023-03-06 LAB — COMPREHENSIVE METABOLIC PANEL
ALT: 28 U/L (ref 0–53)
AST: 23 U/L (ref 0–37)
Albumin: 4.6 g/dL (ref 3.5–5.2)
Alkaline Phosphatase: 63 U/L (ref 39–117)
BUN: 18 mg/dL (ref 6–23)
CO2: 29 meq/L (ref 19–32)
Calcium: 10 mg/dL (ref 8.4–10.5)
Chloride: 103 meq/L (ref 96–112)
Creatinine, Ser: 0.92 mg/dL (ref 0.40–1.50)
GFR: 90.1 mL/min (ref >60.00)
Glucose, Bld: 161 mg/dL — ABNORMAL HIGH (ref 70–99)
Potassium: 4.5 meq/L (ref 3.5–5.1)
Sodium: 141 meq/L (ref 135–145)
Total Bilirubin: 0.5 mg/dL (ref 0.2–1.2)
Total Protein: 7.2 g/dL (ref 6.0–8.3)

## 2023-03-06 LAB — CBC WITH DIFFERENTIAL/PLATELET
Basophils Absolute: 0 10*3/uL (ref 0.0–0.1)
Basophils Relative: 0.3 % (ref 0.0–3.0)
Eosinophils Absolute: 0.2 10*3/uL (ref 0.0–0.7)
Eosinophils Relative: 2.5 % (ref 0.0–5.0)
HCT: 45.2 % (ref 39.0–52.0)
Hemoglobin: 15.3 g/dL (ref 13.0–17.0)
Lymphocytes Relative: 23 % (ref 12.0–46.0)
Lymphs Abs: 1.7 10*3/uL (ref 0.7–4.0)
MCHC: 33.8 g/dL (ref 30.0–36.0)
MCV: 86.4 fL (ref 78.0–100.0)
Monocytes Absolute: 0.5 10*3/uL (ref 0.1–1.0)
Monocytes Relative: 7.3 % (ref 3.0–12.0)
Neutro Abs: 5 10*3/uL (ref 1.4–7.7)
Neutrophils Relative %: 66.9 % (ref 43.0–77.0)
Platelets: 211 10*3/uL (ref 150.0–400.0)
RBC: 5.23 Mil/uL (ref 4.22–5.81)
RDW: 14.4 % (ref 11.5–15.5)
WBC: 7.4 10*3/uL (ref 4.0–10.5)

## 2023-03-06 LAB — LIPID PANEL
Cholesterol: 119 mg/dL (ref 0–200)
HDL: 28.9 mg/dL — ABNORMAL LOW (ref >39.00)
LDL Cholesterol: 50 mg/dL (ref 0–99)
NonHDL: 90.54
Total CHOL/HDL Ratio: 4
Triglycerides: 201 mg/dL — ABNORMAL HIGH (ref 0.0–149.0)
VLDL: 40.2 mg/dL — ABNORMAL HIGH (ref 0.0–40.0)

## 2023-03-06 LAB — TSH: TSH: 1.08 u[IU]/mL (ref 0.35–5.50)

## 2023-03-06 LAB — HEMOGLOBIN A1C: Hgb A1c MFr Bld: 8.6 % — ABNORMAL HIGH (ref 4.6–6.5)

## 2023-03-06 NOTE — Progress Notes (Signed)
Phone: [phone]  Subjective:  Patient 62 y.o. male presenting for annual physical.  Chief Complaint  Patient presents with   Annual Exam    Non Fasting w/ labs    HPI: The patient, a former smoker with a history of heavy use and hx of MI, quit smoking three years ago. He reports occasional alcohol use, consuming a fifth every three months. He has no known family history of prostate cancer. He reports no urinary issues, such as a weak stream or frequent urination, but does wake up at night to urinate, which he attributes to high water intake due to his medications. He has regular bowel movements and no recent illnesses. He has not seen a dermatologist for screenings, but has noticed some moles on his back that have grown larger and occasionally secrete a substance. He has not been concerned about these moles, and does not want a dermatology referral at this time.  See problem oriented charting- ROS- full  review of systems was completed and negative.  The following were reviewed and entered/updated in epic: Past Medical History:  Diagnosis Date   Arthritis    BMI 32.0-32.9,adult    CAD (coronary artery disease)    Diabetes mellitus without complication (HCC)    Hypertension    Kidney stones 2006   Myocardial infarction [facility])    Pneumonia    Patient Active Problem List   Diagnosis Date Noted   Type 2 diabetes mellitus with obesity (HCC) 03/06/2023   Class 2 severe obesity due to excess calories with serious comorbidity and body mass index (BMI) of 35.0 to 35.9 in adult [facility]) 03/06/2023   Osteoarthritis of left hip 04/18/2021   Pre-operative general physical examination 03/19/2021   Chronic left hip pain 03/19/2021   Erectile dysfunction 03/19/2021   CAD (coronary artery disease)    STEMI (ST elevation myocardial infarction) (HCC) 03/18/2018   Coronary artery disease involving native coronary artery of native heart with unstable angina pectoris (HCC) 03/17/2018   Acute  combined systolic and diastolic heart failure (HCC) - DHF noted, EF not known 03/17/2018   Essential hypertension 03/17/2018   Hyperlipidemia with target LDL less than 70 03/17/2018   Chest pain 03/04/2014   Unstable angina (HCC) 03/04/2014   Past Surgical History:  Procedure Laterality Date   CARDIAC CATHETERIZATION     CORONARY STENT PLACEMENT  11/28/2011   2nd stent on 12/03/11   CORONARY/GRAFT ACUTE MI REVASCULARIZATION N/A 03/17/2018   Procedure: Coronary/Graft Acute MI Revascularization;  Surgeon: Marykay Lex, MD;  Location: [facility] INVASIVE CV LAB;  Service: Cardiovascular;  Laterality: N/A;   LEFT AND RIGHT HEART CATHETERIZATION WITH CORONARY ANGIOGRAM  11/29/2011   Procedure: LEFT AND RIGHT HEART CATHETERIZATION WITH CORONARY ANGIOGRAM;  Surgeon: Robynn Pane, MD;  Location: [facility] CATH LAB;  Service: Cardiovascular;;   LEFT HEART CATH AND CORONARY ANGIOGRAPHY N/A 03/17/2018   Procedure: LEFT HEART CATH AND CORONARY ANGIOGRAPHY;  Surgeon: Marykay Lex, MD;  Location: [facility] INVASIVE CV LAB;  Service: Cardiovascular;  Laterality: N/A;   LEFT HEART CATHETERIZATION WITH CORONARY ANGIOGRAM N/A 12/03/2011   Procedure: LEFT HEART CATHETERIZATION WITH CORONARY ANGIOGRAM;  Surgeon: Robynn Pane, MD;  Location: [facility] CATH LAB;  Service: Cardiovascular;  Laterality: N/A;   NO PAST SURGERIES     PERCUTANEOUS CORONARY STENT INTERVENTION (PCI-S)  11/29/2011   Procedure: PERCUTANEOUS CORONARY STENT INTERVENTION (PCI-S);  Surgeon: Robynn Pane, MD;  Location: [facility] CATH LAB;  Service: Cardiovascular;;   TOTAL HIP ARTHROPLASTY Left 04/18/2021  Procedure: TOTAL HIP ARTHROPLASTY ANTERIOR APPROACH;  Surgeon: Samson Frederic, MD;  Location: [facility];  Service: Orthopedics;  Laterality: Left;   TRACHEOSTOMY      Family History  Problem Relation Age of Onset   Other Father     Medications- reviewed and updated Current Outpatient Medications  Medication Sig Dispense Refill   albuterol (VENTOLIN HFA)  108 (90 Base) MCG/ACT inhaler Inhale 1-2 puffs into the lungs every 6 (six) hours as needed for wheezing or shortness of breath. 18 g 0   atorvastatin (LIPITOR) 80 MG tablet Take 1 tablet (80 mg total) by mouth daily with breakfast. 30 tablet 3   docusate sodium (COLACE) 100 MG capsule Take 1 capsule (100 mg total) by mouth 2 (two) times daily. 60 capsule 0   FARXIGA 10 MG TABS tablet Take 10 mg by mouth daily.     losartan (COZAAR) 100 MG tablet Take 100 mg by mouth daily.     meloxicam (MOBIC) 7.5 MG tablet Take 7.5 mg by mouth daily.     metoprolol succinate (TOPROL-XL) 25 MG 24 hr tablet Take 25 mg by mouth daily.     ondansetron (ZOFRAN) 4 MG tablet Take 1 tablet (4 mg total) by mouth every 6 (six) hours as needed for nausea. 20 tablet 0   senna (SENOKOT) 8.6 MG TABS tablet Take 2 tablets (17.2 mg total) by mouth at bedtime. 60 tablet 0   metFORMIN (GLUCOPHAGE-XR) 500 MG 24 hr tablet Take 1 tablet (500 mg total) by mouth in the morning and at bedtime. 180 tablet 0   nitroGLYCERIN (NITROSTAT) 0.4 MG SL tablet Place 1 tablet (0.4 mg total) under the tongue every 5 (five) minutes x 3 doses as needed for chest pain. 25 tablet 3   oseltamivir (TAMIFLU) 75 MG capsule Take 1 capsule (75 mg total) by mouth 2 (two) times daily. (Patient not taking: Reported on 03/06/2023) 10 capsule 0   No current facility-administered medications for this visit.    Allergies-reviewed and updated Allergies  Allergen Reactions   Codeine Nausea And Vomiting    Social History   Social History Narrative   Not on file    Objective:  BP 118/72 (BP Location: Left Arm, Patient Position: Sitting, Cuff Size: Large)   Pulse 72   Temp 98 F (36.7 C) (Temporal)   Ht 5' 3.5" (1.613 m)   Wt 206 lb 6 oz (93.6 kg)   SpO2 97%   BMI 35.98 kg/m  Physical Exam Vitals and nursing note reviewed.  Constitutional:      General: He is not in acute distress.    Appearance: Normal appearance. He is obese.  HENT:     Head:  Normocephalic.     Right Ear: Tympanic membrane and external ear normal.     Left Ear: Tympanic membrane and external ear normal.     Nose: Nose normal.     Mouth/Throat:     Mouth: Mucous membranes are moist.  Eyes:     Extraocular Movements: Extraocular movements intact.  Cardiovascular:     Rate and Rhythm: Normal rate and regular rhythm.  Pulmonary:     Effort: Pulmonary effort is normal.     Breath sounds: Normal breath sounds.  Abdominal:     General: Abdomen is flat. There is no distension.     Palpations: Abdomen is soft.     Tenderness: There is no abdominal tenderness.  Musculoskeletal:        General: Normal range of motion.  Cervical back: Normal range of motion.  Skin:    General: Skin is warm and dry.  Neurological:     Mental Status: He is alert and oriented to person, place, and time.  Psychiatric:        Mood and Affect: Mood normal.        Behavior: Behavior normal.        Judgment: Judgment normal.      Assessment and Plan   Health Maintenance counseling: 1. Anticipatory guidance: Patient counseled regarding regular dental exams q6 months, eye exams yearly, avoiding smoking and second hand smoke, limiting alcohol to 2 beverages per day.   2. Risk factor reduction:  Advised patient of need for regular exercise and diet rich in fruits and vegetables to reduce risk of heart attack and stroke. Exercise- none.   Wt Readings from Last 3 Encounters:  03/06/23 206 lb 6 oz (93.6 kg)  04/18/21 200 lb 13.4 oz (91.1 kg)  04/09/21 200 lb 13.4 oz (91.1 kg)   3. Immunizations/screenings/ancillary studies Immunization History  Administered Date(s) Administered   Pneumococcal Polysaccharide-23 11/30/2011   Tdap 03/06/2023   Health Maintenance Due  Topic Date Due   HIV Screening  Never done   Lung Cancer Screening  Never done   HEMOGLOBIN A1C  10/07/2021   Diabetic kidney evaluation - eGFR measurement  04/19/2022    4. Prostate cancer screening >55yo - risk  factors? No results found for: "PSA"  ordering today. 5. Colon cancer screening: ordering today, never done. 6. Skin cancer screening-  advised regular sunscreen use. Denies worrisome, changing, or new skin lesions.  7. Smoking associated screening (lung cancer screening, AAA screen 65-75, UA)-  ex-smoker- 1-1.5 ppd for 43 yrs. 8. STD screening - N/A 9. Alcohol screening: 1/5 of liquor in 3mos.  Annual physical exam - pt not seen for 2 years. Sees Cardiology regularly d/t MI hx. Stopped smoking 89yrs ago. Ordering lung ca screening CT scan today, PSA, Colonoscopy. Rechecking A1C, hx T2DM.  -     TSH -     Lipid panel -     CBC with Differential/Platelet -     Comprehensive metabolic panel -     PSA  Screening for HIV (human immunodeficiency virus) -     HIV Antibody (routine testing w rflx)  Need for hepatitis C screening test -     Hepatitis C antibody  Screening for colon cancer -     Ambulatory referral to Gastroenterology  Type 2 diabetes mellitus with obesity (HCC) - last A1C 04/2021 6.8, on Farxiga and Metformin through Dr. Sharyn Lull. pt advised on need for weight loss, exercise, low carb diet. will continue to monitor.  -     Hemoglobin A1c  Encounter for screening for lung cancer -     CT CHEST LUNG CANCER SCREENING LOW DOSE WO CONTRAST; Future  Need for Tdap vaccination -     Tdap vaccine greater than or equal to 7yo IM   Recommended follow up:  Return for any future concerns, Complete physical w/fasting labs. No future appointments.   Lab/Order associations: non-fasting    Dulce Sellar, NP

## 2023-03-06 NOTE — Patient Instructions (Signed)
It was very nice to see you today!    I will review your lab results via MyChart in a few days.   Merry Christmas!      PLEASE NOTE:  If you had any lab tests please let us know if you have not heard back within a few days. You may see your results on MyChart before we have a chance to review them but we will give you a call once they are reviewed by us. If we ordered any referrals today, please let us know if you have not heard from their office within the next week.    

## 2023-03-06 NOTE — Assessment & Plan Note (Signed)
chronic last A1C 6.8 04/2021 taking Farxiga and Metformin 500mg  bid thru cardiologist rechecking A1C today pt advised on weight loss, exercise, low carb diet will continue to monitor

## 2023-03-07 LAB — HIV ANTIBODY (ROUTINE TESTING W REFLEX): HIV 1&2 Ab, 4th Generation: NONREACTIVE

## 2023-03-07 LAB — HEPATITIS C ANTIBODY: Hepatitis C Ab: NONREACTIVE

## 2023-03-11 NOTE — Progress Notes (Signed)
A1C (type 2 diabetes) is up to 8.6 which is highest it has been that I can see in his chart. He should increase his Metformin to 2 tabs in am and pm, continue the [place] and he can let me know if he is ok to do Mounjaro injections weekly. This is very good for weight loss and will lower A1C even at low doses, it is given weekly, and we can show him how to do. If not willing then will need to start Glipizide. Let me know what he wants to do - he can also schedule a visit to discuss in detail if he would like. Thx

## 2023-03-14 ENCOUNTER — Ambulatory Visit
Admission: RE | Admit: 2023-03-14 | Discharge: 2023-03-14 | Disposition: A | Discharge status: Home / Self Care | Payer: No Typology Code available for payment source | Source: Ambulatory Visit | Attending: Family | Admitting: Family

## 2023-03-14 DIAGNOSIS — Z122 Encounter for screening for malignant neoplasm of respiratory organs: Secondary | ICD-10-CM

## 2023-03-14 NOTE — Progress Notes (Signed)
noted 

## 2023-03-27 ENCOUNTER — Encounter: Payer: Self-pay | Admitting: Family

## 2023-03-27 DIAGNOSIS — I700 Atherosclerosis of aorta: Secondary | ICD-10-CM | POA: Insufficient documentation

## 2023-03-27 DIAGNOSIS — J439 Emphysema, unspecified: Secondary | ICD-10-CM | POA: Insufficient documentation

## 2023-03-27 NOTE — Progress Notes (Signed)
CT scan results indicate he has some mild emphysema and bronchiectasis from past smoking, but no lung cancer detected.  If his breathing sx are not controlled with his albuterol inhaler he needs to let us know. He has aortic atherosclerosis (plaque build up in largest artery) as well as plaque and calcification in his coronary arteries. I will forward results to his cardiologist.  Very important to reduce the saturated fat in his diet (animal fat, dairy, etc) and keep taking his cholesterol medication, Lipitor, daily.

## 2023-05-12 ENCOUNTER — Encounter: Payer: Self-pay | Admitting: Gastroenterology

## 2023-06-06 ENCOUNTER — Ambulatory Visit: Payer: No Typology Code available for payment source

## 2023-06-06 ENCOUNTER — Other Ambulatory Visit: Payer: Self-pay

## 2023-06-06 VITALS — Ht 63.5 in | Wt 205.0 lb

## 2023-06-06 DIAGNOSIS — Z1211 Encounter for screening for malignant neoplasm of colon: Secondary | ICD-10-CM

## 2023-06-06 MED ORDER — SUFLAVE 178.7 G PO SOLR: 1.0000 | ORAL | 0 refills | Status: DC

## 2023-06-06 NOTE — Progress Notes (Signed)
 Denies allergies to eggs or soy products. Denies complication of anesthesia or sedation. Denies use of weight loss medication. Denies use of O2.   Emmi instructions given for colonoscopy.

## 2023-07-04 ENCOUNTER — Encounter: Payer: No Typology Code available for payment source | Admitting: Gastroenterology

## 2023-07-25 ENCOUNTER — Encounter: Payer: Self-pay | Admitting: Gastroenterology

## 2023-08-01 ENCOUNTER — Ambulatory Visit (AMBULATORY_SURGERY_CENTER): Admitting: Gastroenterology

## 2023-08-01 ENCOUNTER — Encounter: Payer: Self-pay | Admitting: Gastroenterology

## 2023-08-01 VITALS — BP 131/81 | HR 65 | Temp 98.3°F | Resp 11 | Ht 63.5 in | Wt 205.0 lb

## 2023-08-01 DIAGNOSIS — D125 Benign neoplasm of sigmoid colon: Secondary | ICD-10-CM

## 2023-08-01 DIAGNOSIS — Z1211 Encounter for screening for malignant neoplasm of colon: Secondary | ICD-10-CM

## 2023-08-01 DIAGNOSIS — K635 Polyp of colon: Secondary | ICD-10-CM | POA: Diagnosis not present

## 2023-08-01 DIAGNOSIS — K641 Second degree hemorrhoids: Secondary | ICD-10-CM | POA: Diagnosis not present

## 2023-08-01 DIAGNOSIS — D124 Benign neoplasm of descending colon: Secondary | ICD-10-CM | POA: Diagnosis not present

## 2023-08-01 DIAGNOSIS — D120 Benign neoplasm of cecum: Secondary | ICD-10-CM

## 2023-08-01 DIAGNOSIS — D128 Benign neoplasm of rectum: Secondary | ICD-10-CM | POA: Diagnosis not present

## 2023-08-01 DIAGNOSIS — K5730 Diverticulosis of large intestine without perforation or abscess without bleeding: Secondary | ICD-10-CM | POA: Diagnosis not present

## 2023-08-01 DIAGNOSIS — D122 Benign neoplasm of ascending colon: Secondary | ICD-10-CM | POA: Diagnosis not present

## 2023-08-01 MED ORDER — SODIUM CHLORIDE 0.9 % IV SOLN: 500.0000 mL | Freq: Once | INTRAVENOUS | Status: DC

## 2023-08-01 NOTE — Progress Notes (Signed)
 GASTROENTEROLOGY PROCEDURE H&P NOTE   Primary Care Physician: Versa Gore, NP    Reason for Procedure:  Colon Cancer screening  Plan:    Colonoscopy  Patient is appropriate for endoscopic procedure(s) in the ambulatory ([facility]) setting.  The nature of the procedure, as well as the risks, benefits, and alternatives were carefully and thoroughly reviewed with the patient. Ample time for discussion and questions allowed. The patient understood, was satisfied, and agreed to proceed.     HPI: Eddie Lewis is a 63 y.o. male who presents for colonoscopy for routine Colon Cancer screening.  No active GI symptoms.  No known family history of colon cancer or related malignancy.  Patient is otherwise without complaints or active issues today.  Past Medical History:  Diagnosis Date   Arthritis    BMI 32.0-32.9,adult    CAD (coronary artery disease)    Chest pain 03/04/2014   Chronic left hip pain 03/19/2021   Diabetes mellitus without complication (HCC)    Erectile dysfunction 03/19/2021   Hyperlipidemia    Hypertension    Kidney stones 2006   Myocardial infarction [facility])    Pneumonia    Pre-operative general physical examination 03/19/2021    Past Surgical History:  Procedure Laterality Date   CARDIAC CATHETERIZATION     CORONARY STENT PLACEMENT  11/28/2011   2nd stent on 12/03/11   CORONARY/GRAFT ACUTE MI REVASCULARIZATION N/A 03/17/2018   Procedure: Coronary/Graft Acute MI Revascularization;  Surgeon: Arleen Lacer, MD;  Location: [facility] INVASIVE CV LAB;  Service: Cardiovascular;  Laterality: N/A;   LEFT AND RIGHT HEART CATHETERIZATION WITH CORONARY ANGIOGRAM  11/29/2011   Procedure: LEFT AND RIGHT HEART CATHETERIZATION WITH CORONARY ANGIOGRAM;  Surgeon: Sharene Dauer, MD;  Location: [facility] CATH LAB;  Service: Cardiovascular;;   LEFT HEART CATH AND CORONARY ANGIOGRAPHY N/A 03/17/2018   Procedure: LEFT HEART CATH AND CORONARY ANGIOGRAPHY;  Surgeon: Arleen Lacer, MD;   Location: [facility] INVASIVE CV LAB;  Service: Cardiovascular;  Laterality: N/A;   LEFT HEART CATHETERIZATION WITH CORONARY ANGIOGRAM N/A 12/03/2011   Procedure: LEFT HEART CATHETERIZATION WITH CORONARY ANGIOGRAM;  Surgeon: Sharene Dauer, MD;  Location: [facility] CATH LAB;  Service: Cardiovascular;  Laterality: N/A;   NO PAST SURGERIES     PERCUTANEOUS CORONARY STENT INTERVENTION (PCI-S)  11/29/2011   Procedure: PERCUTANEOUS CORONARY STENT INTERVENTION (PCI-S);  Surgeon: Sharene Dauer, MD;  Location: [facility] CATH LAB;  Service: Cardiovascular;;   TOTAL HIP ARTHROPLASTY Left 04/18/2021   Procedure: TOTAL HIP ARTHROPLASTY ANTERIOR APPROACH;  Surgeon: Adonica Hoose, MD;  Location: [facility] ORS;  Service: Orthopedics;  Laterality: Left;   TRACHEOSTOMY      Prior to Admission medications   Medication Sig Start Date End Date Taking? Authorizing Provider  aspirin  EC 81 MG tablet Take 81 mg by mouth daily. Swallow whole.    [provider]  atorvastatin  (LIPITOR ) 80 MG tablet Take 1 tablet (80 mg total) by mouth daily with breakfast. 03/06/14   Chapman Commodore, MD  FARXIGA  10 MG TABS tablet Take 10 mg by mouth daily. 12/26/20   [provider]  losartan  (COZAAR ) 100 MG tablet Take 100 mg by mouth daily. 12/25/20   [provider]  metFORMIN  (GLUCOPHAGE -XR) 500 MG 24 hr tablet Take 1 tablet (500 mg total) by mouth in the morning and at bedtime. 03/23/21 06/21/21  Versa Gore, NP  metoprolol  succinate (TOPROL -XL) 25 MG 24 hr tablet Take 25 mg by mouth daily.    [provider]  nitroGLYCERIN  (NITROSTAT )  0.4 MG SL tablet Place 1 tablet (0.4 mg total) under the tongue every 5 (five) minutes x 3 doses as needed for chest pain. 03/06/14 03/30/21  Chapman Commodore, MD  OVER THE COUNTER MEDICATION Fish Oil one gel capsule daily.    [provider]  PEG 3350 -KCl-NaCl-NaSulf-MgSul (SUFLAVE ) 178.7 g SOLR Take 1 kit by mouth as directed. 06/06/23   Debora Stockdale V, DO    Current  Outpatient Medications  Medication Sig Dispense Refill   aspirin  EC 81 MG tablet Take 81 mg by mouth daily. Swallow whole.     atorvastatin  (LIPITOR ) 80 MG tablet Take 1 tablet (80 mg total) by mouth daily with breakfast. 30 tablet 3   FARXIGA  10 MG TABS tablet Take 10 mg by mouth daily.     losartan  (COZAAR ) 100 MG tablet Take 100 mg by mouth daily.     metFORMIN  (GLUCOPHAGE -XR) 500 MG 24 hr tablet Take 1 tablet (500 mg total) by mouth in the morning and at bedtime. 180 tablet 0   metoprolol  succinate (TOPROL -XL) 25 MG 24 hr tablet Take 25 mg by mouth daily.     nitroGLYCERIN  (NITROSTAT ) 0.4 MG SL tablet Place 1 tablet (0.4 mg total) under the tongue every 5 (five) minutes x 3 doses as needed for chest pain. 25 tablet 3   OVER THE COUNTER MEDICATION Fish Oil one gel capsule daily.     PEG 3350 -KCl-NaCl-NaSulf-MgSul (SUFLAVE ) 178.7 g SOLR Take 1 kit by mouth as directed. 1 each 0   No current facility-administered medications for this visit.    Allergies as of 08/01/2023 - Review Complete 06/06/2023  Allergen Reaction Noted   Codeine Nausea And Vomiting 11/29/2011    Family History  Problem Relation Age of Onset   Other Father    Colon cancer Neg Hx    Esophageal cancer Neg Hx    Rectal cancer Neg Hx    Stomach cancer Neg Hx     Social History   Socioeconomic History   Marital status: Married    Spouse name: Not on file   Number of children: Not on file   Years of education: Not on file   Highest education level: Not on file  Occupational History   Not on file  Tobacco Use   Smoking status: Former    Current packs/day: 0.00    Average packs/day: 1 pack/day for 33.0 years (33.0 ttl pk-yrs)    Types: Cigarettes    Start date: 11/27/1978    Quit date: 11/27/2011    Years since quitting: 11.6   Smokeless tobacco: Never  Vaping Use   Vaping status: Never Used  Substance and Sexual Activity   Alcohol  use: Yes    Alcohol /week: 1.0 standard drink of alcohol     Types: 1 Cans  of beer per week    Comment: occ   Drug use: No   Sexual activity: Yes  Other Topics Concern   Not on file  Social History Narrative   Not on file   Social Drivers of Health   Financial Resource Strain: Not on file  Food Insecurity: Not on file  Transportation Needs: Not on file  Physical Activity: Not on file  Stress: Not on file  Social Connections: Not on file  Intimate Partner Violence: Not on file    Physical Exam: Vital signs in last 24 hours: @There  were no vitals taken for this visit. GEN: NAD EYE: Sclerae anicteric ENT: MMM CV: Non-tachycardic Pulm: CTA b/l GI: Soft, NT/ND NEURO:  Alert & Oriented x 3   Harry Lindau, DO [facility]   08/01/2023 12:42 PM

## 2023-08-01 NOTE — Progress Notes (Signed)
 Called to room to assist during endoscopic procedure.  Patient ID and intended procedure confirmed with present staff. Received instructions for my participation in the procedure from the performing physician.

## 2023-08-01 NOTE — Patient Instructions (Signed)
 Resume previous diet Continue present medications Await pathology results Repeat colonoscopy for surveillance based on pathology results Return to GI office as needed YOU HAD AN ENDOSCOPIC PROCEDURE TODAY AT THE [facility]:   Refer to the procedure report that was given to you for any specific questions about what was found during the examination.  If the procedure report does not answer your questions, please call your gastroenterologist to clarify.  If you requested that your care partner not be given the details of your procedure findings, then the procedure report has been included in a sealed envelope for you to review at your convenience later.  YOU SHOULD EXPECT: Some feelings of bloating in the abdomen. Passage of more gas than usual.  Walking can help get rid of the air that was put into your GI tract during the procedure and reduce the bloating. If you had a lower endoscopy (such as a colonoscopy or flexible sigmoidoscopy) you may notice spotting of blood in your stool or on the toilet paper. If you underwent a bowel prep for your procedure, you may not have a normal bowel movement for a few days.  Please Note:  You might notice some irritation and congestion in your nose or some drainage.  This is from the oxygen  used during your procedure.  There is no need for concern and it should clear up in a day or so.  SYMPTOMS TO REPORT IMMEDIATELY: Following lower endoscopy (colonoscopy or flexible sigmoidoscopy):  Excessive amounts of blood in the stool  Significant tenderness or worsening of abdominal pains  Swelling of the abdomen that is new, acute  Fever of 100F or higher  For urgent or emergent issues, a gastroenterologist can be reached at any hour by calling [phone]. Do not use MyChart messaging for urgent concerns.   DIET:  We do recommend a small meal at first, but then you may proceed to your regular diet.  Drink plenty of fluids but you should avoid alcoholic  beverages for 24 hours.  ACTIVITY:  You should plan to take it easy for the rest of today and you should NOT DRIVE or use heavy machinery until tomorrow (because of the sedation medicines used during the test).    FOLLOW UP: Our staff will call the number listed on your records the next business day following your procedure.  We will call around 7:15- 8:00 am to check on you and address any questions or concerns that you may have regarding the information given to you following your procedure. If we do not reach you, we will leave a message.     If any biopsies were taken you will be contacted by phone or by letter within the next 1-3 weeks.  Please call us  at [phone] if you have not heard about the biopsies in 3 weeks.   SIGNATURES/CONFIDENTIALITY: You and/or your care partner have signed paperwork which will be entered into your electronic medical record.  These signatures attest to the fact that that the information above on your After Visit Summary has been reviewed and is understood.  Full responsibility of the confidentiality of this discharge information lies with you and/or your care-partner.

## 2023-08-01 NOTE — Op Note (Signed)
 [facility] Patient Name: Eddie Lewis Procedure Date: 08/01/2023 1:21 PM MRN: [id number] Endoscopist: Harry Lindau , MD, [id number] Age: 63 Referring MD:  Date of Birth: [birth year] Gender: Male Account #: [id number] Procedure:                Colonoscopy Indications:              Screening for colorectal malignant neoplasm, This                            is the patient's first colonoscopy Medicines:                Monitored Anesthesia Care Procedure:                Pre-Anesthesia Assessment:                           - Prior to the procedure, a History and Physical                            was performed, and patient medications and                            allergies were reviewed. The patient's tolerance of                            previous anesthesia was also reviewed. The risks                            and benefits of the procedure and the sedation                            options and risks were discussed with the patient.                            All questions were answered, and informed consent                            was obtained. Prior Anticoagulants: The patient has                            taken no anticoagulant or antiplatelet agents. ASA                            Grade Assessment: II - A patient with mild systemic                            disease. After reviewing the risks and benefits,                            the patient was deemed in satisfactory condition to                            undergo the procedure.  After obtaining informed consent, the colonoscope                            was passed under direct vision. Throughout the                            procedure, the patient's blood pressure, pulse, and                            oxygen  saturations were monitored continuously. The                            CF [id number] #[id number] was introduced through the anus                            and advanced to the the  terminal ileum. The                            colonoscopy was performed without difficulty. The                            patient tolerated the procedure well. The quality                            of the bowel preparation was good. The terminal                            ileum, ileocecal valve, appendiceal orifice, and                            rectum were photographed. Scope In: 1:36:08 PM Scope Out: 2:16:10 PM Scope Withdrawal Time: 0 hours 37 minutes 30 seconds  Total Procedure Duration: 0 hours 40 minutes 2 seconds  Findings:                 The perianal and digital rectal examinations were                            normal.                           Two sessile polyps were found in the proximal                            ascending colon and cecum. The cecal polyps was 10                            mm and the ascending colon polyp was 20 mm in size.                            These polyps were removed with a cold snare.                            Resection and retrieval were complete. To close  a                            defect after polypectomy, two hemostatic clips were                            successfully placed (MR conditional) at the                            ascending colon site. There was no bleeding at the                            end of the procedure. Area on the contralateral                            wall was tattooed with an injection of 2 mL of Spot                            (carbon black).                           Five sessile polyps were found in the sigmoid colon                            and descending colon. The polyps were 3 to 6 mm in                            size. These polyps were removed with a cold snare.                            Resection and retrieval were complete. Estimated                            blood loss was minimal.                           A 12 mm polyp was found in the sigmoid colon. The                            polyp was  pedunculated. The polyp was removed with                            a cold snare. Resection and retrieval were                            complete. Estimated blood loss was minimal.                           A 3 mm polyp was found in the rectum. The polyp was                            sessile. The polyp was removed with a cold snare.  Resection and retrieval were complete. Estimated                            blood loss was minimal.                           Multiple large-mouthed and small-mouthed                            diverticula were found in the sigmoid colon,                            descending colon, ascending colon and cecum.                           Retroflexion in the right colon was performed.                           Internal hemorrhoids were found during                            retroflexion. The hemorrhoids were small and Grade                            II (internal hemorrhoids that prolapse but reduce                            spontaneously).                           The terminal ileum appeared normal. Complications:            No immediate complications. Estimated Blood Loss:     Estimated blood loss was minimal. Impression:               - Two 10 to 20 mm polyps in the proximal ascending                            colon and in the cecum, removed with a cold snare.                            Resected and retrieved. Clips (MR conditional) were                            placed. Tattooed.                           - Five 3 to 6 mm polyps in the sigmoid colon and in                            the descending colon, removed with a cold snare.                            Resected and retrieved.                           -  One 12 mm polyp in the sigmoid colon, removed                            with a cold snare. Resected and retrieved.                           - One 3 mm polyp in the rectum, removed with a cold                             snare. Resected and retrieved.                           - Diverticulosis in the sigmoid colon, in the                            descending colon, in the ascending colon and in the                            cecum.                           - Internal hemorrhoids.                           - The examined portion of the ileum was normal. Recommendation:           - Patient has a contact number available for                            emergencies. The signs and symptoms of potential                            delayed complications were discussed with the                            patient. Return to normal activities tomorrow.                            Written discharge instructions were provided to the                            patient.                           - Resume previous diet.                           - Continue present medications.                           - Await pathology results.                           - Repeat colonoscopy for surveillance based on  pathology results.                           - Return to GI office PRN. Harry Lindau, MD 08/01/2023 2:24:49 PM

## 2023-08-01 NOTE — Progress Notes (Signed)
 To pacu, VSS. Report to [email or web address]

## 2023-08-01 NOTE — Progress Notes (Signed)
 Pt's states no medical or surgical changes since previsit or office visit.

## 2023-08-04 ENCOUNTER — Telehealth: Payer: Self-pay

## 2023-08-04 NOTE — Telephone Encounter (Signed)
 Left message

## 2023-08-06 LAB — SURGICAL PATHOLOGY

## 2023-08-09 ENCOUNTER — Encounter: Payer: Self-pay | Admitting: Gastroenterology

## 2024-03-09 ENCOUNTER — Encounter: Payer: No Typology Code available for payment source | Admitting: Family
